# Patient Record
Sex: Female | Born: 1950 | Race: White | Hispanic: No | Smoking: Never smoker
Health system: Southern US, Community
[De-identification: ages and names within clinical notes are randomized; demographics above are authoritative.]

## PROBLEM LIST (undated history)

## (undated) DIAGNOSIS — N979 Female infertility, unspecified: Secondary | ICD-10-CM

## (undated) DIAGNOSIS — D219 Benign neoplasm of connective and other soft tissue, unspecified: Secondary | ICD-10-CM

## (undated) DIAGNOSIS — K7030 Alcoholic cirrhosis of liver without ascites: Secondary | ICD-10-CM

## (undated) HISTORY — DX: Benign neoplasm of connective and other soft tissue, unspecified: D21.9

## (undated) HISTORY — DX: Female infertility, unspecified: N97.9

## (undated) HISTORY — DX: Alcoholic cirrhosis of liver without ascites: K70.30

---

## 1990-01-14 HISTORY — PX: LAPAROSCOPY: SHX197

## 1997-05-27 ENCOUNTER — Other Ambulatory Visit: Admission: RE | Admit: 1997-05-27 | Discharge: 1997-05-27 | Payer: Self-pay | Admitting: Gynecology

## 1997-08-02 ENCOUNTER — Other Ambulatory Visit: Admission: RE | Admit: 1997-08-02 | Discharge: 1997-08-02 | Payer: Self-pay | Admitting: Gynecology

## 1998-07-27 ENCOUNTER — Other Ambulatory Visit: Admission: RE | Admit: 1998-07-27 | Discharge: 1998-07-27 | Payer: Self-pay | Admitting: Gynecology

## 1999-07-31 ENCOUNTER — Other Ambulatory Visit: Admission: RE | Admit: 1999-07-31 | Discharge: 1999-07-31 | Payer: Self-pay | Admitting: Gynecology

## 2002-10-26 ENCOUNTER — Other Ambulatory Visit: Admission: RE | Admit: 2002-10-26 | Discharge: 2002-10-26 | Payer: Self-pay | Admitting: Obstetrics and Gynecology

## 2003-11-18 ENCOUNTER — Other Ambulatory Visit: Admission: RE | Admit: 2003-11-18 | Discharge: 2003-11-18 | Payer: Self-pay | Admitting: Obstetrics and Gynecology

## 2005-04-18 ENCOUNTER — Ambulatory Visit: Payer: Self-pay | Admitting: Otolaryngology

## 2007-03-01 ENCOUNTER — Other Ambulatory Visit: Payer: Self-pay

## 2007-03-02 ENCOUNTER — Inpatient Hospital Stay: Payer: Self-pay | Admitting: Surgery

## 2007-09-23 ENCOUNTER — Inpatient Hospital Stay (HOSPITAL_COMMUNITY): Admission: RE | Admit: 2007-09-23 | Discharge: 2007-09-24 | Payer: Self-pay | Admitting: Urology

## 2009-01-14 HISTORY — PX: BOWEL RESECTION: SHX1257

## 2009-01-14 HISTORY — PX: PARTIAL HYSTERECTOMY: SHX80

## 2010-01-14 HISTORY — PX: ABDOMINAL SACROCOLPOPEXY: SHX1114

## 2010-02-04 ENCOUNTER — Encounter: Payer: Self-pay | Admitting: Obstetrics and Gynecology

## 2010-05-29 NOTE — Op Note (Signed)
Katie Castro, Katie Castro            ACCOUNT NO.:  [id number]   MEDICAL RECORD NO.:  [id number]          PATIENT TYPE:  INP   LOCATION:  1437                         FACILITY:  [facility]   PHYSICIAN:  Heloise Purpura, MD      DATE OF BIRTH:  [birth year]   DATE OF PROCEDURE:  09/23/2007  DATE OF DISCHARGE:                               OPERATIVE REPORT   PREOPERATIVE DIAGNOSIS:  Vaginal vault prolapse.   POSTOPERATIVE DIAGNOSIS:  Vaginal vault prolapse.   PROCEDURE:  1. Robotic assisted laparoscopic sacrocolpopexy.  2. Cystoscopy.   SURGEON:  Dr. Heloise Purpura.   FIRST ASSISTANT:  Dr. Lorin Picket MacDiarmid.   SECOND ASSISTANT:  Delia Chimes, nurse practitioner.   ANESTHESIA:  General.   COMPLICATIONS:  None.   ESTIMATED BLOOD LOSS:  Minimal.   INTRAVENOUS FLUIDS:  3500 mL of lactated Ringer's.   DRAINS:  16-French Foley catheter.   INDICATION:  Ms. 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  After a thorough evaluation  by Dr. Sherron Monday, she was felt to be a most appropriate candidate for  an abdominal sacrocolpopexy.  Due to her desire to have this performed  in a minimally invasive fashion, she did see me, and we discussed a  robotic-assisted laparoscopic approach which she did wish to proceed  with.  After discussion regarding the potential risks, complications,  and the potential benefits, she did wish to proceed.  Alternative  options were also discussed in detail with the patient.  Informed  consent was obtained.   DESCRIPTION OF PROCEDURE:  The patient was taken to the operating room,  and a general anesthetic was administered.  She was given preoperative  antibiotics, placed in the dorsal lithotomy position, prepped and draped  in the usual sterile fashion.  Next, a Foley catheter was inserted into  the bladder.  She was noted to have severe vault prolapse.  Due to the  fact that she did undergo a prior supracervical  hysterectomy, her cervix  was identified on pelvic examination.  A site was then selected just  superior to the umbilicus in the midline for placement of the camera  port.  This was placed using a standard open Hassan technique which  allowed entry into the peritoneal cavity under direct vision without  difficulty.  A 12-mm port was then placed and a pneumoperitoneum  established.  With the 0-degree lens, the abdomen was inspected.  Particularly, the left lower quadrant was inspected where the patient  did have a prior port site hernia from her prior laparoscopic  hysterectomy procedure.  There were no adhesions seen in this area, and  the remaining ports were then placed.  Bilateral 8-mm robotic ports were  placed 10 cm lateral to and just inferior to the camera port site.  An  additional 8-mm robotic port was placed in the far left lateral  abdominal wall.  A 12-mm port was then placed in the far right lateral  abdominal wall for laparoscopic assistance.  All ports were placed under  direct vision and without difficulty.  The surgical cart was then  docked.  With the aid of the cautery scissors, the vaginal apex and  cervix was identified.  A vaginal probe was then placed into the vagina  to help tent it superiorly.  Once the apex was identified, the overlying  cervical tissue was incised horizontally creating a flap between the  vagina and the cervix and bladder anteriorly as well as a flap created  posteriorly.  Once the flaps were created, attention then turned to the  sacral promontory.  The ProGrasp was used to hold the sigmoid colon over  to the left thereby exposing the sacral promontory.  The peritoneum was  then incised over the promontory thereby exposing the periosteum.  Care  was taken to avoid any small veins which were identified.  In addition,  the right ureter was identified laterally.  The peritoneum was then  incised in a caudal direction, and the Prolene mesh graft was  then  prepared.  It was sutured into a Y configuration, and the mesh was then  brought into the operative field.  The wide component of the mesh was  then placed over the anterior and posterior aspect of the vagina, and 2-  0 Gore-Tex sutures were used to tack the mesh to the vagina in 4  quadrants anteriorly and 4 quadrants posteriorly.  An appropriate amount  of length was left on the mesh with Dr. Mina Marble assistance, the  appropriate amount of support was identified.  Three 2-0 Gore-Tex  sutures were then placed into the periosteum of the sacral promontory  and used to secure the proximal end of the mesh.  Dr. Sherron Monday then  performed a vaginal exam and felt this resulted in excellent vaginal  support.  The peritoneum was then closed over the graft with 2-0 Vicryl  running suture.  In addition, the peritoneum was reapproximated in the  pelvis to obliterate the cul-de-sac and reduce the risk of enterocele.  At this point, the surgical cart was undocked.  Attention then turned to  cystoscopy.  Dr. Sherron Monday performed cystoscopy and noted blue reflux  from each ureteral orifice after the administration of indigo carmine.  The Foley catheter was then replaced, and preparations were made for  closure of the abdominal incisions.  The right lateral 12-mm port site  was closed with a 0 Vicryl suture placed with the aid of a suture passer  device.  All remaining ports were then removed under direct vision.  The  12-mm camera port was then removed, and this opening was closed with a  running 0 Vicryl suture.  The insufflation gas had been expelled prior  to closure of the abdomen.  Quarter percent Marcaine was then injected  into all incision sites which were reapproximated at the skin level with  4-0 Monocryl subcuticular closures.  Dermabond was applied to the skin.  The patient appeared to tolerate the procedure well and without  complications.  She was able to be extubated and  transferred to recovery  unit in satisfactory condition.      Heloise Purpura, MD  Electronically Signed     LB/MEDQ  D:  09/23/2007  T:  09/24/2007  Job:  [id number]

## 2010-05-29 NOTE — Discharge Summary (Signed)
NAMESARANNE, CRISLIP            ACCOUNT NO.:  [id number]   MEDICAL RECORD NO.:  [id number]          PATIENT TYPE:  INP   LOCATION:  1437                         FACILITY:  [facility]   PHYSICIAN:  Heloise Purpura, MD      DATE OF BIRTH:  29-Jul-1950   DATE OF ADMISSION:  09/23/2007  DATE OF DISCHARGE:  09/24/2007                               DISCHARGE SUMMARY   ADMISSION DIAGNOSIS:  Vaginal vault prolapse.   DISCHARGE DIAGNOSIS:  Vaginal vault prolapse.   HISTORY AND PHYSICAL:  For full details, please see admission history  and physical.  Briefly, Ms. Encinas is a 60 year old female with vaginal  wall prolapse, status post hysterectomy for uterine fibroids.  After  undergoing a urodynamic evaluation and discussing management options for  treatment of her vault prolapse, she elected to proceed with a robotic  assisted laparoscopic sacrocolpopexy.   HOSPITAL COURSE:  On 09/23/2007, the patient was taken the operating  room and underwent a robotic assisted laparoscopic sacrocolpopexy.  She  tolerated this procedure well and without complications.  Postoperatively, she was able to be transferred to a regular hospital  room following recovery from anesthesia.  She was monitored and remained  hemodynamically stable the night of surgery.  Her hemoglobin did  decrease from 13.7-11.5 the morning after surgery.  Although she had  excellent urine output and her heart rate remained stable, she did have  a slightly low blood pressure with systolic blood pressures measuring 90-  100.  She was closely monitored and continued to be stable.  Her  hemoglobin was rechecked 6 hours later and remained completely stable at  11.6.  It was therefore felt that this was dilutional and related to her  intravenous fluid hydration.  She did void following catheter removal on  postoperative day #1.  However, her postvoid residuals remained elevated  between 300-350 mL.  She was therefore instructed on  intermittent  catheterization and was able to be discharged home.  She was tolerating  a clear liquid diet.   DISPOSITION:  Home.   DISCHARGE MEDICATIONS:  She was instructed to resume her regular home  medications, except any aspirin, nonsteroidal anti-inflammatory drugs,  or herbal supplements.  She was given a prescription to take Vicodin as  needed for pain.   DISCHARGE INSTRUCTIONS:  She was instructed to be ambulatory, but  specifically told to refrain from any heavy lifting, strenuous activity,  or driving.  She was instructed not to engage in sexual intercourse or  place anything  in the vagina for 6 weeks.  In addition, she was instructed on clean  intermittent catheterization and told to catheterize after voiding until  her postvoid residuals were less than 150 mL.   FOLLOWUP:  She will follow-up as scheduled in 1-2 weeks for further  evaluation and for a PVR.      Heloise Purpura, MD  Electronically Signed     LB/MEDQ  D:  09/24/2007  T:  09/25/2007  Job:  [id number]   cc:   Martina Sinner, MD  Fax: [phone]

## 2010-10-17 LAB — CBC
HCT: 41.1
Platelets: 232
RDW: 14.2

## 2010-10-17 LAB — BASIC METABOLIC PANEL
BUN: 6
Calcium: 9.5
Creatinine, Ser: 0.57
GFR calc Af Amer: >60
GFR calc non Af Amer: >60

## 2010-10-17 LAB — HEMOGLOBIN AND HEMATOCRIT, BLOOD
HCT: 33.5 — ABNORMAL LOW
Hemoglobin: 11.6 — ABNORMAL LOW

## 2010-10-17 LAB — TYPE AND SCREEN

## 2013-05-14 HISTORY — PX: FOOT SURGERY: SHX648

## 2013-08-17 ENCOUNTER — Encounter: Payer: Self-pay | Admitting: Gynecology

## 2013-08-17 ENCOUNTER — Ambulatory Visit (INDEPENDENT_AMBULATORY_CARE_PROVIDER_SITE_OTHER): Payer: BC Managed Care – PPO | Admitting: Gynecology

## 2013-08-17 VITALS — BP 116/76 | HR 80 | Resp 18 | Ht 68.5 in | Wt 199.0 lb

## 2013-08-17 DIAGNOSIS — R319 Hematuria, unspecified: Secondary | ICD-10-CM

## 2013-08-17 DIAGNOSIS — Z0000 Encounter for general adult medical examination without abnormal findings: Secondary | ICD-10-CM

## 2013-08-17 DIAGNOSIS — Z90711 Acquired absence of uterus with remaining cervical stump: Secondary | ICD-10-CM | POA: Insufficient documentation

## 2013-08-17 DIAGNOSIS — Z90710 Acquired absence of both cervix and uterus: Secondary | ICD-10-CM | POA: Diagnosis not present

## 2013-08-17 DIAGNOSIS — Z9889 Other specified postprocedural states: Secondary | ICD-10-CM | POA: Diagnosis not present

## 2013-08-17 DIAGNOSIS — N950 Postmenopausal bleeding: Secondary | ICD-10-CM | POA: Diagnosis not present

## 2013-08-17 DIAGNOSIS — N3000 Acute cystitis without hematuria: Secondary | ICD-10-CM | POA: Diagnosis not present

## 2013-08-17 DIAGNOSIS — Z01419 Encounter for gynecological examination (general) (routine) without abnormal findings: Secondary | ICD-10-CM

## 2013-08-17 DIAGNOSIS — Z9049 Acquired absence of other specified parts of digestive tract: Secondary | ICD-10-CM | POA: Insufficient documentation

## 2013-08-17 DIAGNOSIS — Z1211 Encounter for screening for malignant neoplasm of colon: Secondary | ICD-10-CM

## 2013-08-17 DIAGNOSIS — Z124 Encounter for screening for malignant neoplasm of cervix: Secondary | ICD-10-CM | POA: Diagnosis not present

## 2013-08-17 LAB — HEMOGLOBIN, FINGERSTICK: Hemoglobin, fingerstick: 13.2 g/dL (ref 12.0–16.0)

## 2013-08-17 LAB — POCT URINALYSIS DIPSTICK
PH UA: 5
Urobilinogen, UA: NEGATIVE

## 2013-08-17 NOTE — Patient Instructions (Signed)

## 2013-08-17 NOTE — Progress Notes (Signed)
63 y.o. Single Caucasian female   G1P1001 here for annual exam. Pt reports menses are not regular due to Hysterectomy.  She does not report hot flashes, does have night sweats, does not know have vaginal dryness.  She is not using lubricants.  She does report post-menopasual bleeding.  Pt reports vaginal? Bleeding that has come and gone for 57m, can be enough for a pad. None for the past 35m?  Pt has a pelvic mesh placed in 2009-robotic sacrocolpopexy.  Last pap was pre-hysterectomy, no abnormal pap.    Patient's last menstrual period was 01/14/2006.          Sexually active: No.  The current method of family planning is status post hysterectomy.    Exercising: No.  The patient does not participate in regular exercise at present. Last pap: 2009 - wnl Abnormal PAP: no Mammogram: 2009- normal BSE: yes  Colonoscopy: never had one DEXA: 2009  Alcohol: 10 drinks/wk Tobacco: no  Hgb: 13.2 ; Urine: Nitrates +, Leuks 1  There are no preventive care reminders to display for this patient.  Family History  Problem Relation Age of Onset  . Diabetes Father   . Diabetes Brother   . Heart attack Father     There are no active problems to display for this patient.   Past Medical History  Diagnosis Date  . Fibroid   . Infertility, female     Past Surgical History  Procedure Laterality Date  . Partial hysterectomy  2011  . Foot surgery  05/2013  . Laparoscopy  1992    Allergies: Review of patient's allergies indicates no known allergies.  No current outpatient prescriptions on file.   No current facility-administered medications for this visit.    ROS: Pertinent items are noted in HPI.  Exam:    BP 116/76  Pulse 80  Resp 18  Ht 5' 8.5" (1.74 m)  Wt 199 lb (90.266 kg)  BMI 29.81 kg/m2  LMP 01/14/2006 Weight change: @WEIGHTCHANGE @ Last 3 height recordings:  Ht Readings from Last 3 Encounters:  08/17/13 5' 8.5" (1.74 m)   General appearance: alert, cooperative and appears  stated age Head: Normocephalic, without obvious abnormality, atraumatic Neck: no adenopathy, no carotid bruit, no JVD, supple, symmetrical, trachea midline and thyroid not enlarged, symmetric, no tenderness/mass/nodules Lungs: clear to auscultation bilaterally Breasts: Inspection negative, No nipple retraction or dimpling, No nipple discharge or bleeding Heart: regular rate and rhythm, S1, S2 normal, no murmur, click, rub or gallop Abdomen: soft, non-tender; bowel sounds normal; no masses,  no organomegaly Extremities: extremities normal, atraumatic, no cyanosis, marked edema right foot from recent surgery Skin: Skin color, texture, turgor normal. No rashes or lesions Lymph nodes: Cervical, supraclavicular, and axillary nodes normal. no inguinal nodes palpated Neurologic: Grossly normal   Pelvic: External genitalia:  no lesions              Urethra: normal appearing urethra with no masses, tenderness or lesions              Bartholins and Skenes: Bartholin's, Urethra, Skene's normal                 Vagina: normal appearing vagina with normal color and discharge, no lesions              Cervix: normal appearance              Pap taken: Yes.          Bimanual Exam:  Uterus:  absent  Adnexa:    no masses                                      Rectovaginal: Confirms                                      Anus:  normal sphincter tone, no lesions           1. Routine gynecological examination  Mammogram overdue-declines our making appt for her pap smear today counseled on menopause, adequate intake of calcium and vitamin D, diet and exercise return annually or prn Discussed PAP guideline changes, importance of weight bearing exercises, calcium, vit D and balanced diet. 2. Laboratory examination ordered as part of a routine general medical examination  - POCT Urinalysis Dipstick - Hemoglobin, fingerstick  3. Postmenopausal bleeding Pt with retained  cervix, not currently bleeding, last pap approx 6y ago, supracervical hysterectomy done laparoscopically for fibroids, discussed residual myometrium? Vs cervical bleeding. Vs urinary.  Assured mesh not contributory.  - Pap Test with HP (IPS) - US Transvaginal Non-OB; Future  4. Special screening for malignant neoplasms, colon overdue - Ambulatory referral to Gastroenterology  5. Screening for cervical cancer Reviewed guidelines based on retained cervix - Pap Test with HP (IPS)  6. Hematuria  - Urine culture  An After Visit Summary was printed and given to the patient.  Additional 65m spent counseling on ddx of PMB after hysterectomy> 50% face to face

## 2013-08-18 MED ORDER — CIPROFLOXACIN HCL 500 MG PO TABS: 500.0000 mg | ORAL_TABLET | Freq: Two times a day (BID) | ORAL | Status: DC

## 2013-08-18 NOTE — Addendum Note (Signed)
Addended by: Elveria Rising on: 08/18/2013 05:29 PM   Modules accepted: Orders

## 2013-08-19 ENCOUNTER — Telehealth: Payer: Self-pay | Admitting: *Deleted

## 2013-08-19 LAB — URINE CULTURE

## 2013-08-19 LAB — IPS PAP TEST WITH HPV

## 2013-08-19 NOTE — Telephone Encounter (Signed)
I have attempted to contact this patient by phone with the following results: left message to return my call on answering machine (home per [facility]).[phone]

## 2013-08-19 NOTE — Telephone Encounter (Signed)
Message copied by Graylon Good on Thu Aug 19, 2013 11:02 AM ------      Message from: Elveria Rising      Created: Wed Aug 18, 2013  5:28 PM       INFORM UTI, NKDA, CIPRO CALLED IN ------

## 2013-08-20 NOTE — Progress Notes (Signed)
Quick Note:  AEX scheduled for 08/19/14 ______

## 2013-08-23 NOTE — Telephone Encounter (Signed)
Pt notified in result note by Olga Millers on 08/23/13.

## 2013-08-25 ENCOUNTER — Telehealth: Payer: Self-pay | Admitting: Gynecology

## 2013-08-25 NOTE — Telephone Encounter (Signed)
Left message for patient to call back. Need to go over benefits and schedule PUS °

## 2013-09-06 ENCOUNTER — Encounter: Payer: Self-pay | Admitting: Gynecology

## 2013-09-06 ENCOUNTER — Ambulatory Visit (INDEPENDENT_AMBULATORY_CARE_PROVIDER_SITE_OTHER): Payer: BC Managed Care – PPO | Admitting: Gynecology

## 2013-09-06 ENCOUNTER — Telehealth: Payer: Self-pay | Admitting: Emergency Medicine

## 2013-09-06 VITALS — BP 154/90 | HR 88 | Resp 16 | Ht 68.5 in | Wt 203.0 lb

## 2013-09-06 DIAGNOSIS — K645 Perianal venous thrombosis: Secondary | ICD-10-CM

## 2013-09-06 DIAGNOSIS — K625 Hemorrhage of anus and rectum: Secondary | ICD-10-CM

## 2013-09-06 DIAGNOSIS — N950 Postmenopausal bleeding: Secondary | ICD-10-CM

## 2013-09-06 LAB — CBC WITH DIFFERENTIAL/PLATELET
BASOS PCT: 0 % (ref 0–1)
Basophils Absolute: 0 10*3/uL (ref 0.0–0.1)
EOS ABS: 0.3 10*3/uL (ref 0.0–0.7)
EOS PCT: 5 % (ref 0–5)
HCT: 37.5 % (ref 36.0–46.0)
Hemoglobin: 12.7 g/dL (ref 12.0–15.0)
LYMPHS ABS: 1 10*3/uL (ref 0.7–4.0)
Lymphocytes Relative: 18 % (ref 12–46)
MCH: 33.9 pg (ref 26.0–34.0)
MCHC: 33.9 g/dL (ref 30.0–36.0)
MCV: 100 fL (ref 78.0–100.0)
Monocytes Absolute: 0.6 10*3/uL (ref 0.1–1.0)
Monocytes Relative: 10 % (ref 3–12)
NEUTROS PCT: 67 % (ref 43–77)
Neutro Abs: 3.9 10*3/uL (ref 1.7–7.7)
PLATELETS: 159 10*3/uL (ref 150–400)
RBC: 3.75 MIL/uL — AB (ref 3.87–5.11)
RDW: 14.4 % (ref 11.5–15.5)
WBC: 5.8 10*3/uL (ref 4.0–10.5)

## 2013-09-06 LAB — POCT URINALYSIS DIPSTICK
Blood, UA: 2
LEUKOCYTES UA: NEGATIVE
PH UA: 5
UROBILINOGEN UA: NEGATIVE

## 2013-09-06 MED ORDER — HYDROCORTISONE ACETATE 25 MG RE SUPP: 25.0000 mg | Freq: Two times a day (BID) | RECTAL | Status: DC

## 2013-09-06 NOTE — Addendum Note (Signed)
Addended by: Elveria Rising on: 09/06/2013 05:16 PM   Modules accepted: Level of Service

## 2013-09-06 NOTE — Progress Notes (Signed)
Subjective:     Patient ID: Katie Castro, female   DOB: [birth year], 63 y.o.   MRN: [id number]  HPI Comments: Pt called this am reporting pelvic cramping and bleeding that she noticed over the weekend.  Pt reports a moderate amount of blood in the toilet twice, she placed a pad and saw no further bleeding.  Pt states she did not have a bowel movement before the bleeding.  Pt reported some abdominal bruising as well. Pt had a vaginal vault suspension, supracervical hysterectomy and SBO with resection.   Pt referred for colonoscopy, appt 10/23/13. PUS scheduled  Vaginal Bleeding Pertinent negatives include no chills, constipation or diarrhea.     Review of Systems  Constitutional: Negative for chills and fatigue.  Gastrointestinal: Positive for blood in stool. Negative for diarrhea, constipation and rectal pain.  Genitourinary: Positive for vaginal bleeding.  Neurological: Negative for dizziness.       Objective:   Physical Exam  Nursing note and vitals reviewed. Constitutional: She is oriented to person, place, and time. She appears well-developed and well-nourished.  Genitourinary:   Pelvic: External genitalia:  Mild erosions              Urethra:  normal appearing urethra with no masses, tenderness or lesions              Bartholins and Skenes: normal                 Vagina: normal appearing vagina with normal color and discharge, no lesions, no blood              Cervix: normal appearance, no bleeding                                    Rectovaginal: Confirms, no bleeding, no fissue               Anus:  normal sphincter tone, no lesions   Neurological: She is alert and oriented to person, place, and time.       Assessment:     PMB, no evidence of cervical source-     Plan:     Anoscopy performed, anoscope advanced into rectal canal, thrombosed hemorrhoids noted high, no active bleeding, no tenderness, small spot of blood noted on slow retreat of anoscope Pt  tolerated well, findings reviewed anusol HC suppositiories. Will see if Dr Collene Mares wants to see pt sooner

## 2013-09-06 NOTE — Telephone Encounter (Signed)
Patient is having blood in stool.

## 2013-09-06 NOTE — Telephone Encounter (Signed)
Spoke with Katie Castro at Dr. Lorie Apley office.  Patient having some rectal bleeding and asked by Dr. Charlies Constable to see if Dr. Collene Mares would like to see patient earlier for colonscopy. CBC pending.  Katie Castro requested that I send office notes and she will have Dr. Collene Mares review.  Katie Castro states that patient chose this date 10/23/13 for colonoscopy.

## 2013-09-06 NOTE — Telephone Encounter (Signed)
Spoke with patient. Advised that I spoke with Dr.Lathrop and she would like her to come in to be seen today. Patient is agreeable but lives in [place]. Patient can be here between 12:45-1pm. Placed on schedule for 12:55pm. Advised that we are working her in to Dr.Lathrops schedule. Patient agreeable.  Routing to provider for final review. Patient agreeable to disposition. Will close encounter

## 2013-09-06 NOTE — Telephone Encounter (Signed)
Spoke with patient. Patient states that she was seen with Dr.Lathrop this month and was having blood in her "stool" that Dr.Lathrop wanted to see where it was coming from. Patient states that she thinks "we can rule out the bladder and anus. I think it is vaginal. I have had three laparoscopic surgeries with an abdominal mesh. It is a lot of blood." Patient states that it started on Saturday and again this morning. States that it occurs with using the restroom and is usually in the middle of the night. Patient has been experiencing stomach "discomfort" that feels like cramping. States Dr.Lathrop noticed bruising on lower abdomen when she was seen last. Advised would speak with Dr.Lathrop and give patient a call back with further recommendations and instructions. Patient agreeable.

## 2013-09-06 NOTE — Telephone Encounter (Signed)
Left message to call [place] Warriner at [phone]. 

## 2013-09-08 ENCOUNTER — Telehealth: Payer: Self-pay | Admitting: *Deleted

## 2013-09-08 LAB — URINE CULTURE

## 2013-09-08 NOTE — Telephone Encounter (Signed)
Left Message To Call Back  

## 2013-09-08 NOTE — Telephone Encounter (Signed)
Message copied by Alfonzo Feller on Wed Sep 08, 2013  9:46 AM ------      Message from: Elveria Rising      Created: Tue Sep 07, 2013  5:38 PM       Cbc looks great! ------

## 2013-09-10 ENCOUNTER — Telehealth: Payer: Self-pay | Admitting: Gynecology

## 2013-09-10 NOTE — Telephone Encounter (Signed)
Spoke with patient. Advised that per benefit quote received, she will be responsible for $30 copay when she comes in for PUS. °Patient agreeable. °Scheduled PUS. °Advised patient of 72 hour cancellation policy and $100 cancellation fee. Patient agreeable. °

## 2013-09-10 NOTE — Telephone Encounter (Signed)
Patient notified see result note 

## 2013-09-14 ENCOUNTER — Ambulatory Visit (INDEPENDENT_AMBULATORY_CARE_PROVIDER_SITE_OTHER): Payer: BC Managed Care – PPO

## 2013-09-14 ENCOUNTER — Encounter: Payer: Self-pay | Admitting: Gynecology

## 2013-09-14 ENCOUNTER — Ambulatory Visit (INDEPENDENT_AMBULATORY_CARE_PROVIDER_SITE_OTHER): Payer: BC Managed Care – PPO | Admitting: Gynecology

## 2013-09-14 ENCOUNTER — Telehealth: Payer: Self-pay | Admitting: Internal Medicine

## 2013-09-14 VITALS — BP 128/74 | Resp 18 | Ht 68.5 in | Wt 206.0 lb

## 2013-09-14 DIAGNOSIS — Z90710 Acquired absence of both cervix and uterus: Secondary | ICD-10-CM

## 2013-09-14 DIAGNOSIS — N950 Postmenopausal bleeding: Secondary | ICD-10-CM

## 2013-09-14 DIAGNOSIS — Z90711 Acquired absence of uterus with remaining cervical stump: Secondary | ICD-10-CM

## 2013-09-14 LAB — CBC
HEMATOCRIT: 35.5 % — AB (ref 36.0–46.0)
Hemoglobin: 11.9 g/dL — ABNORMAL LOW (ref 12.0–15.0)
MCH: 33.3 pg (ref 26.0–34.0)
MCHC: 33.5 g/dL (ref 30.0–36.0)
MCV: 99.4 fL (ref 78.0–100.0)
PLATELETS: 188 10*3/uL (ref 150–400)
RBC: 3.57 MIL/uL — ABNORMAL LOW (ref 3.87–5.11)
RDW: 14.5 % (ref 11.5–15.5)
WBC: 5.9 10*3/uL (ref 4.0–10.5)

## 2013-09-14 NOTE — Progress Notes (Signed)
     Pt here for PUS due to recurrent bleeding, pt had a supracervical hysterectomy for fibroids done laparoscopically.  We wanted to rule out residual uterine tissue, negative PAP. Pt reports having 2 more episodes of bleeding both at night that scared her.  She did not start the suppositories but did pick up the Rx. Images reviewed with pt Her cervix appears normal, no fluid in cavity is noted, no myometrium noted.  Ovaries are menopausal in appearance, no free fluid A/P: Recurrent bleeding, neg PUS and PAP Pt scheduled for colonoscopy 10/10, have stressed need to move up. Will try to make appt for pt. CBC now Questions addressed 19m spent counseling, >50% face to face

## 2013-09-14 NOTE — Telephone Encounter (Signed)
Dr. Ardis Hughs is Doc of the Day, please see message below.

## 2013-09-15 NOTE — Telephone Encounter (Signed)
Happy to see her, but the Dr's office note states she is scheduled for colonoscoy 10/10.  Not sure if that is true. She is not scheduled with [place].  Can you clarify with Dr., patient.   Could we add her on with extender or MD of the day sometime this week?

## 2013-09-15 NOTE — Telephone Encounter (Signed)
She is a Dr Collene Mares pt and has an appt on 10/11/13 to see her.  She will stay with that practice.

## 2013-11-15 ENCOUNTER — Encounter: Payer: Self-pay | Admitting: Gynecology

## 2013-11-24 ENCOUNTER — Ambulatory Visit: Payer: Self-pay | Admitting: Urgent Care

## 2013-11-24 ENCOUNTER — Ambulatory Visit: Payer: Self-pay | Admitting: Unknown Physician Specialty

## 2013-11-24 LAB — CBC WITH DIFFERENTIAL/PLATELET
BASOS ABS: 0.1 10*3/uL (ref 0.0–0.1)
Basophil %: 1.1 %
EOS ABS: 0.1 10*3/uL (ref 0.0–0.7)
Eosinophil %: 1.3 %
HCT: 31.7 % — AB (ref 35.0–47.0)
HGB: 10.6 g/dL — ABNORMAL LOW (ref 12.0–16.0)
LYMPHS PCT: 14.1 %
Lymphocyte #: 1.3 10*3/uL (ref 1.0–3.6)
MCH: 37.2 pg — AB (ref 26.0–34.0)
MCHC: 33.5 g/dL (ref 32.0–36.0)
MCV: 111 fL — ABNORMAL HIGH (ref 80–100)
Monocyte #: 0.6 x10 3/mm (ref 0.2–0.9)
Monocyte %: 6 %
NEUTROS ABS: 7.3 10*3/uL — AB (ref 1.4–6.5)
NEUTROS PCT: 77.5 %
PLATELETS: 141 10*3/uL — AB (ref 150–440)
RBC: 2.86 10*6/uL — ABNORMAL LOW (ref 3.80–5.20)
RDW: 17.7 % — ABNORMAL HIGH (ref 11.5–14.5)
WBC: 9.4 10*3/uL (ref 3.6–11.0)

## 2013-11-24 LAB — HEPATIC FUNCTION PANEL A (ARMC)
ALBUMIN: 2.6 g/dL — AB (ref 3.4–5.0)
ALK PHOS: 585 U/L — AB
ALT: 78 U/L — AB
AST: 283 U/L — AB (ref 15–37)
Bilirubin, Direct: 7.7 mg/dL — ABNORMAL HIGH (ref 0.0–0.2)
Bilirubin,Total: 10.1 mg/dL — ABNORMAL HIGH (ref 0.2–1.0)
TOTAL PROTEIN: 7.6 g/dL (ref 6.4–8.2)

## 2013-11-24 LAB — BASIC METABOLIC PANEL
ANION GAP: 14 (ref 7–16)
BUN: 3 mg/dL — ABNORMAL LOW (ref 7–18)
CO2: 24 mmol/L (ref 21–32)
CREATININE: 0.46 mg/dL — AB (ref 0.60–1.30)
Calcium, Total: 8.4 mg/dL — ABNORMAL LOW (ref 8.5–10.1)
Chloride: 99 mmol/L (ref 98–107)
EGFR (African American): >60
GLUCOSE: 87 mg/dL (ref 65–99)
OSMOLALITY: 270 (ref 275–301)
POTASSIUM: 3.5 mmol/L (ref 3.5–5.1)
SODIUM: 137 mmol/L (ref 136–145)

## 2013-11-24 LAB — ETHANOL: Ethanol: 138 mg/dL

## 2013-12-01 ENCOUNTER — Ambulatory Visit: Payer: Self-pay | Admitting: Urgent Care

## 2013-12-01 LAB — HEPATIC FUNCTION PANEL A (ARMC)
Albumin: 2.4 g/dL — ABNORMAL LOW (ref 3.4–5.0)
Alkaline Phosphatase: 428 U/L — ABNORMAL HIGH
Bilirubin, Direct: 15 mg/dL — ABNORMAL HIGH (ref 0.0–0.2)
Bilirubin,Total: 19.5 mg/dL — ABNORMAL HIGH (ref 0.2–1.0)
SGOT(AST): 157 U/L — ABNORMAL HIGH (ref 15–37)
SGPT (ALT): 50 U/L
Total Protein: 7.3 g/dL (ref 6.4–8.2)

## 2013-12-01 LAB — MAGNESIUM: Magnesium: 1.7 mg/dL — ABNORMAL LOW

## 2013-12-01 LAB — CBC WITH DIFFERENTIAL/PLATELET
Bands: 10 %
HCT: 31.4 % — AB (ref 35.0–47.0)
HGB: 10.7 g/dL — AB (ref 12.0–16.0)
Lymphocytes: 10 %
MCH: 38.3 pg — AB (ref 26.0–34.0)
MCHC: 34 g/dL (ref 32.0–36.0)
MCV: 113 fL — ABNORMAL HIGH (ref 80–100)
Monocytes: 6 %
PLATELETS: 163 10*3/uL (ref 150–440)
RBC: 2.78 10*6/uL — ABNORMAL LOW (ref 3.80–5.20)
RDW: 18.7 % — AB (ref 11.5–14.5)
SEGMENTED NEUTROPHILS: 74 %
WBC: 10.5 10*3/uL (ref 3.6–11.0)

## 2013-12-01 LAB — BASIC METABOLIC PANEL
Anion Gap: 10 (ref 7–16)
BUN: 5 mg/dL — ABNORMAL LOW (ref 7–18)
Calcium, Total: 8.5 mg/dL (ref 8.5–10.1)
Chloride: 95 mmol/L — ABNORMAL LOW (ref 98–107)
Co2: 27 mmol/L (ref 21–32)
Creatinine: 0.64 mg/dL (ref 0.60–1.30)
EGFR (African American): >60
EGFR (Non-African Amer.): >60
Glucose: 107 mg/dL — ABNORMAL HIGH (ref 65–99)
Osmolality: 262 (ref 275–301)
Potassium: 2.9 mmol/L — ABNORMAL LOW (ref 3.5–5.1)
Sodium: 132 mmol/L — ABNORMAL LOW (ref 136–145)

## 2013-12-01 LAB — ETHANOL: Ethanol: <3 mg/dL

## 2013-12-01 LAB — PROTIME-INR
INR: 1.6
Prothrombin Time: 18.3 secs — ABNORMAL HIGH (ref 11.5–14.7)

## 2013-12-02 LAB — AFP TUMOR MARKER: AFP TUMOR MARKER: 2.2 ng/mL (ref 0.0–8.3)

## 2013-12-03 ENCOUNTER — Ambulatory Visit: Payer: Self-pay | Admitting: Urgent Care

## 2013-12-03 LAB — CBC WITH DIFFERENTIAL/PLATELET
BANDS NEUTROPHIL: 7 %
HCT: 30.8 % — AB (ref 35.0–47.0)
HGB: 10.4 g/dL — ABNORMAL LOW (ref 12.0–16.0)
Lymphocytes: 7 %
MCH: 38.2 pg — AB (ref 26.0–34.0)
MCHC: 33.8 g/dL (ref 32.0–36.0)
MCV: 113 fL — ABNORMAL HIGH (ref 80–100)
Metamyelocyte: 1 %
Monocytes: 5 %
Platelet: 175 10*3/uL (ref 150–440)
RBC: 2.73 10*6/uL — ABNORMAL LOW (ref 3.80–5.20)
RDW: 19 % — ABNORMAL HIGH (ref 11.5–14.5)
Segmented Neutrophils: 80 %
WBC: 9.9 10*3/uL (ref 3.6–11.0)

## 2013-12-03 LAB — HEPATIC FUNCTION PANEL A (ARMC)
ALBUMIN: 2.3 g/dL — AB (ref 3.4–5.0)
Alkaline Phosphatase: 380 U/L — ABNORMAL HIGH
BILIRUBIN DIRECT: 14.6 mg/dL — AB (ref 0.0–0.2)
Bilirubin,Total: 19.6 mg/dL — ABNORMAL HIGH (ref 0.2–1.0)
SGOT(AST): 145 U/L — ABNORMAL HIGH (ref 15–37)
SGPT (ALT): 44 U/L
TOTAL PROTEIN: 7.1 g/dL (ref 6.4–8.2)

## 2013-12-03 LAB — BASIC METABOLIC PANEL
ANION GAP: 9 (ref 7–16)
BUN: 5 mg/dL — AB (ref 7–18)
CALCIUM: 7.9 mg/dL — AB (ref 8.5–10.1)
CO2: 25 mmol/L (ref 21–32)
CREATININE: 0.41 mg/dL — AB (ref 0.60–1.30)
Chloride: 101 mmol/L (ref 98–107)
EGFR (African American): >60
EGFR (Non-African Amer.): >60
Glucose: 88 mg/dL (ref 65–99)
Osmolality: 267 (ref 275–301)
POTASSIUM: 2.9 mmol/L — AB (ref 3.5–5.1)
Sodium: 135 mmol/L — ABNORMAL LOW (ref 136–145)

## 2013-12-03 LAB — PROTIME-INR
INR: 1.6
PROTHROMBIN TIME: 18.7 s — AB (ref 11.5–14.7)

## 2013-12-06 ENCOUNTER — Ambulatory Visit: Payer: Self-pay | Admitting: Urgent Care

## 2013-12-06 LAB — BASIC METABOLIC PANEL
Anion Gap: 11 (ref 7–16)
BUN: 5 mg/dL — ABNORMAL LOW (ref 7–18)
CALCIUM: 8.2 mg/dL — AB (ref 8.5–10.1)
CHLORIDE: 101 mmol/L (ref 98–107)
CREATININE: 0.55 mg/dL — AB (ref 0.60–1.30)
Co2: 22 mmol/L (ref 21–32)
Glucose: 115 mg/dL — ABNORMAL HIGH (ref 65–99)
Osmolality: 266 (ref 275–301)
POTASSIUM: 3.7 mmol/L (ref 3.5–5.1)
Sodium: 134 mmol/L — ABNORMAL LOW (ref 136–145)

## 2013-12-06 LAB — CBC WITH DIFFERENTIAL/PLATELET
BASOS PCT: 1.2 %
Basophil #: 0.2 10*3/uL — ABNORMAL HIGH (ref 0.0–0.1)
EOS ABS: 0.1 10*3/uL (ref 0.0–0.7)
EOS PCT: 0.9 %
HCT: 31.1 % — ABNORMAL LOW (ref 35.0–47.0)
HGB: 10.4 g/dL — AB (ref 12.0–16.0)
LYMPHS ABS: 2.4 10*3/uL (ref 1.0–3.6)
LYMPHS PCT: 19.8 %
MCH: 38.1 pg — AB (ref 26.0–34.0)
MCHC: 33.6 g/dL (ref 32.0–36.0)
MCV: 113 fL — AB (ref 80–100)
MONO ABS: 0.7 x10 3/mm (ref 0.2–0.9)
Monocyte %: 5.4 %
NEUTROS ABS: 8.9 10*3/uL — AB (ref 1.4–6.5)
Neutrophil %: 72.7 %
Platelet: 208 10*3/uL (ref 150–440)
RBC: 2.75 10*6/uL — ABNORMAL LOW (ref 3.80–5.20)
RDW: 18.1 % — AB (ref 11.5–14.5)
WBC: 12.2 10*3/uL — ABNORMAL HIGH (ref 3.6–11.0)

## 2013-12-06 LAB — HEPATIC FUNCTION PANEL A (ARMC)
Albumin: 2.2 g/dL — ABNORMAL LOW (ref 3.4–5.0)
Alkaline Phosphatase: 373 U/L — ABNORMAL HIGH
BILIRUBIN DIRECT: 18 mg/dL — AB (ref 0.0–0.2)
Bilirubin,Total: 21.9 mg/dL — ABNORMAL HIGH (ref 0.2–1.0)
SGOT(AST): 154 U/L — ABNORMAL HIGH (ref 15–37)
SGPT (ALT): 41 U/L
Total Protein: 7.1 g/dL (ref 6.4–8.2)

## 2013-12-06 LAB — PROTIME-INR
INR: 1.7
Prothrombin Time: 19.5 secs — ABNORMAL HIGH (ref 11.5–14.7)

## 2013-12-06 LAB — MAGNESIUM: MAGNESIUM: 2.1 mg/dL

## 2013-12-13 ENCOUNTER — Ambulatory Visit: Payer: Self-pay | Admitting: Urgent Care

## 2013-12-13 LAB — BASIC METABOLIC PANEL
ANION GAP: 10 (ref 7–16)
BUN: 10 mg/dL (ref 7–18)
CALCIUM: 8.7 mg/dL (ref 8.5–10.1)
CO2: 23 mmol/L (ref 21–32)
CREATININE: 0.64 mg/dL (ref 0.60–1.30)
Chloride: 99 mmol/L (ref 98–107)
EGFR (African American): >60
Glucose: 150 mg/dL — ABNORMAL HIGH (ref 65–99)
Osmolality: 266 (ref 275–301)
Potassium: 4.1 mmol/L (ref 3.5–5.1)
SODIUM: 132 mmol/L — AB (ref 136–145)

## 2013-12-13 LAB — CBC WITH DIFFERENTIAL/PLATELET
Basophil #: 0.2 10*3/uL — ABNORMAL HIGH (ref 0.0–0.1)
Basophil %: 1.5 %
EOS ABS: 0 10*3/uL (ref 0.0–0.7)
Eosinophil %: 0.3 %
HCT: 34.5 % — AB (ref 35.0–47.0)
HGB: 11.5 g/dL — ABNORMAL LOW (ref 12.0–16.0)
Lymphocyte #: 0.3 10*3/uL — ABNORMAL LOW (ref 1.0–3.6)
Lymphocyte %: 2.1 %
MCH: 37.3 pg — AB (ref 26.0–34.0)
MCHC: 33.2 g/dL (ref 32.0–36.0)
MCV: 112 fL — ABNORMAL HIGH (ref 80–100)
MONOS PCT: 8.7 %
Monocyte #: 1.3 x10 3/mm — ABNORMAL HIGH (ref 0.2–0.9)
NEUTROS ABS: 12.9 10*3/uL — AB (ref 1.4–6.5)
NEUTROS PCT: 87.4 %
PLATELETS: 291 10*3/uL (ref 150–440)
RBC: 3.07 10*6/uL — ABNORMAL LOW (ref 3.80–5.20)
RDW: 17.3 % — ABNORMAL HIGH (ref 11.5–14.5)
WBC: 14.7 10*3/uL — ABNORMAL HIGH (ref 3.6–11.0)

## 2013-12-13 LAB — HEPATIC FUNCTION PANEL A (ARMC)
ALK PHOS: 350 U/L — AB
ALT: 40 U/L
Albumin: 2.2 g/dL — ABNORMAL LOW (ref 3.4–5.0)
BILIRUBIN TOTAL: 20.8 mg/dL — AB (ref 0.2–1.0)
Bilirubin, Direct: 17 mg/dL — ABNORMAL HIGH (ref 0.0–0.2)
SGOT(AST): 139 U/L — ABNORMAL HIGH (ref 15–37)
TOTAL PROTEIN: 7.3 g/dL (ref 6.4–8.2)

## 2013-12-13 LAB — PROTIME-INR
INR: 1.6
Prothrombin Time: 18.9 secs — ABNORMAL HIGH (ref 11.5–14.7)

## 2013-12-13 LAB — MAGNESIUM: MAGNESIUM: 2.1 mg/dL

## 2013-12-16 ENCOUNTER — Ambulatory Visit: Payer: Self-pay | Admitting: Urgent Care

## 2013-12-16 LAB — CBC WITH DIFFERENTIAL/PLATELET
Basophil #: 0 10*3/uL (ref 0.0–0.1)
Basophil %: 0.1 %
EOS ABS: 0.1 10*3/uL (ref 0.0–0.7)
EOS PCT: 0.3 %
HCT: 35.1 % (ref 35.0–47.0)
HGB: 11.6 g/dL — ABNORMAL LOW (ref 12.0–16.0)
LYMPHS PCT: 6.8 %
Lymphocyte #: 1.3 10*3/uL (ref 1.0–3.6)
MCH: 36.9 pg — ABNORMAL HIGH (ref 26.0–34.0)
MCHC: 33 g/dL (ref 32.0–36.0)
MCV: 112 fL — AB (ref 80–100)
MONO ABS: 0.6 x10 3/mm (ref 0.2–0.9)
Monocyte %: 3.1 %
NEUTROS PCT: 89.7 %
Neutrophil #: 16.9 10*3/uL — ABNORMAL HIGH (ref 1.4–6.5)
Platelet: 257 10*3/uL (ref 150–440)
RBC: 3.15 10*6/uL — ABNORMAL LOW (ref 3.80–5.20)
RDW: 17 % — AB (ref 11.5–14.5)
WBC: 18.8 10*3/uL — AB (ref 3.6–11.0)

## 2013-12-16 LAB — PROTIME-INR
INR: 1.6
PROTHROMBIN TIME: 18.6 s — AB (ref 11.5–14.7)

## 2013-12-24 ENCOUNTER — Inpatient Hospital Stay: Payer: Self-pay | Admitting: Internal Medicine

## 2013-12-24 LAB — CBC WITH DIFFERENTIAL/PLATELET
Basophil #: 0.1 10*3/uL (ref 0.0–0.1)
Basophil %: 0.4 %
EOS PCT: 0 %
Eosinophil #: 0 10*3/uL (ref 0.0–0.7)
HCT: 31.5 % — ABNORMAL LOW (ref 35.0–47.0)
HGB: 10.5 g/dL — ABNORMAL LOW (ref 12.0–16.0)
LYMPHS ABS: 0.4 10*3/uL — AB (ref 1.0–3.6)
LYMPHS PCT: 2.2 %
MCH: 36.6 pg — AB (ref 26.0–34.0)
MCHC: 33.5 g/dL (ref 32.0–36.0)
MCV: 109 fL — ABNORMAL HIGH (ref 80–100)
Monocyte #: 0.5 x10 3/mm (ref 0.2–0.9)
Monocyte %: 3 %
Neutrophil #: 16.1 10*3/uL — ABNORMAL HIGH (ref 1.4–6.5)
Neutrophil %: 94.4 %
PLATELETS: 112 10*3/uL — AB (ref 150–440)
RBC: 2.88 10*6/uL — ABNORMAL LOW (ref 3.80–5.20)
RDW: 15.8 % — ABNORMAL HIGH (ref 11.5–14.5)
WBC: 17 10*3/uL — ABNORMAL HIGH (ref 3.6–11.0)

## 2013-12-24 LAB — URINALYSIS, COMPLETE
Blood: NEGATIVE
Glucose,UR: NEGATIVE mg/dL (ref 0–75)
Ketone: NEGATIVE
NITRITE: NEGATIVE
PH: 5 (ref 4.5–8.0)
Protein: NEGATIVE
RBC,UR: 29 /HPF (ref 0–5)
SPECIFIC GRAVITY: 1.018 (ref 1.003–1.030)
Squamous Epithelial: 8
WBC UR: 19 /HPF (ref 0–5)

## 2013-12-24 LAB — AMMONIA: Ammonia, Plasma: <10 mcmol/L (ref 11–32)

## 2013-12-24 LAB — HEPATIC FUNCTION PANEL A (ARMC)
ALK PHOS: 241 U/L — AB
Albumin: 2.3 g/dL — ABNORMAL LOW (ref 3.4–5.0)
BILIRUBIN DIRECT: 10.3 mg/dL — AB (ref 0.0–0.2)
Bilirubin,Total: 12.9 mg/dL — ABNORMAL HIGH (ref 0.2–1.0)
SGOT(AST): 85 U/L — ABNORMAL HIGH (ref 15–37)
SGPT (ALT): 97 U/L — ABNORMAL HIGH
Total Protein: 7.1 g/dL (ref 6.4–8.2)

## 2013-12-24 LAB — PROTIME-INR
INR: 1.6
Prothrombin Time: 18.8 secs — ABNORMAL HIGH (ref 11.5–14.7)

## 2013-12-24 LAB — BASIC METABOLIC PANEL
ANION GAP: 11 (ref 7–16)
BUN: 17 mg/dL (ref 7–18)
CREATININE: 0.69 mg/dL (ref 0.60–1.30)
Calcium, Total: 8.3 mg/dL — ABNORMAL LOW (ref 8.5–10.1)
Chloride: 97 mmol/L — ABNORMAL LOW (ref 98–107)
Co2: 20 mmol/L — ABNORMAL LOW (ref 21–32)
Glucose: 172 mg/dL — ABNORMAL HIGH (ref 65–99)
Osmolality: 263 (ref 275–301)
POTASSIUM: 3.8 mmol/L (ref 3.5–5.1)
SODIUM: 128 mmol/L — AB (ref 136–145)

## 2013-12-24 LAB — HEMOGLOBIN A1C: HEMOGLOBIN A1C: 4.5 % (ref 4.2–6.3)

## 2013-12-25 LAB — MAGNESIUM: MAGNESIUM: 2 mg/dL

## 2013-12-25 LAB — HEPATIC FUNCTION PANEL A (ARMC)
ALBUMIN: 2 g/dL — AB (ref 3.4–5.0)
ALT: 84 U/L — AB
Alkaline Phosphatase: 199 U/L — ABNORMAL HIGH
Bilirubin, Direct: 8.5 mg/dL — ABNORMAL HIGH (ref 0.0–0.2)
Bilirubin,Total: 10.4 mg/dL — ABNORMAL HIGH (ref 0.2–1.0)
SGOT(AST): 65 U/L — ABNORMAL HIGH (ref 15–37)
TOTAL PROTEIN: 6.1 g/dL — AB (ref 6.4–8.2)

## 2013-12-25 LAB — CBC WITH DIFFERENTIAL/PLATELET
BASOS ABS: 0.3 10*3/uL — AB (ref 0.0–0.1)
Basophil %: 2 %
EOS ABS: 0 10*3/uL (ref 0.0–0.7)
Eosinophil %: 0.2 %
HCT: 28 % — ABNORMAL LOW (ref 35.0–47.0)
HGB: 9.3 g/dL — ABNORMAL LOW (ref 12.0–16.0)
LYMPHS ABS: 0.6 10*3/uL — AB (ref 1.0–3.6)
Lymphocyte %: 4.3 %
MCH: 36.7 pg — AB (ref 26.0–34.0)
MCHC: 33.2 g/dL (ref 32.0–36.0)
MCV: 111 fL — AB (ref 80–100)
MONOS PCT: 4.2 %
Monocyte #: 0.6 x10 3/mm (ref 0.2–0.9)
NEUTROS ABS: 12.3 10*3/uL — AB (ref 1.4–6.5)
Neutrophil %: 89.3 %
Platelet: 90 10*3/uL — ABNORMAL LOW (ref 150–440)
RBC: 2.53 10*6/uL — AB (ref 3.80–5.20)
RDW: 15.3 % — AB (ref 11.5–14.5)
WBC: 13.7 10*3/uL — ABNORMAL HIGH (ref 3.6–11.0)

## 2013-12-25 LAB — BASIC METABOLIC PANEL
Anion Gap: 8 (ref 7–16)
BUN: 13 mg/dL (ref 7–18)
CHLORIDE: 99 mmol/L (ref 98–107)
CREATININE: 0.64 mg/dL (ref 0.60–1.30)
Calcium, Total: 8.1 mg/dL — ABNORMAL LOW (ref 8.5–10.1)
Co2: 23 mmol/L (ref 21–32)
EGFR (African American): >60
EGFR (Non-African Amer.): >60
GLUCOSE: 142 mg/dL — AB (ref 65–99)
OSMOLALITY: 263 (ref 275–301)
Potassium: 3.5 mmol/L (ref 3.5–5.1)
Sodium: 130 mmol/L — ABNORMAL LOW (ref 136–145)

## 2013-12-26 LAB — CBC WITH DIFFERENTIAL/PLATELET
Basophil #: 0.2 10*3/uL — ABNORMAL HIGH (ref 0.0–0.1)
Basophil %: 1.6 %
Eosinophil #: 0 10*3/uL (ref 0.0–0.7)
Eosinophil %: 0.2 %
HCT: 30.1 % — ABNORMAL LOW (ref 35.0–47.0)
HGB: 10.1 g/dL — ABNORMAL LOW (ref 12.0–16.0)
Lymphocyte #: 0.7 10*3/uL — ABNORMAL LOW (ref 1.0–3.6)
Lymphocyte %: 7 %
MCH: 37 pg — ABNORMAL HIGH (ref 26.0–34.0)
MCHC: 33.8 g/dL (ref 32.0–36.0)
MCV: 110 fL — ABNORMAL HIGH (ref 80–100)
Monocyte #: 0.4 x10 3/mm (ref 0.2–0.9)
Monocyte %: 4.2 %
Neutrophil #: 8.1 10*3/uL — ABNORMAL HIGH (ref 1.4–6.5)
Neutrophil %: 87 %
Platelet: 95 10*3/uL — ABNORMAL LOW (ref 150–440)
RBC: 2.75 10*6/uL — ABNORMAL LOW (ref 3.80–5.20)
RDW: 15.6 % — ABNORMAL HIGH (ref 11.5–14.5)
WBC: 9.4 10*3/uL (ref 3.6–11.0)

## 2013-12-26 LAB — VANCOMYCIN, TROUGH: VANCOMYCIN, TROUGH: 10 ug/mL (ref 10–20)

## 2013-12-26 LAB — SODIUM: Sodium: 135 mmol/L — ABNORMAL LOW (ref 136–145)

## 2013-12-26 LAB — POTASSIUM: Potassium: 3.3 mmol/L — ABNORMAL LOW (ref 3.5–5.1)

## 2013-12-27 LAB — CREATININE, SERUM
CREATININE: 0.66 mg/dL (ref 0.60–1.30)
EGFR (African American): >60

## 2013-12-27 LAB — POTASSIUM: Potassium: 3.6 mmol/L (ref 3.5–5.1)

## 2013-12-27 LAB — CULTURE, BLOOD (SINGLE)

## 2013-12-28 DIAGNOSIS — I351 Nonrheumatic aortic (valve) insufficiency: Secondary | ICD-10-CM

## 2013-12-28 LAB — SEDIMENTATION RATE: Erythrocyte Sed Rate: 79 mm/hr — ABNORMAL HIGH (ref 0–30)

## 2013-12-28 LAB — WOUND CULTURE

## 2013-12-31 LAB — CULTURE, BLOOD (SINGLE)

## 2014-01-14 ENCOUNTER — Ambulatory Visit: Payer: Self-pay | Admitting: Oncology

## 2014-01-25 ENCOUNTER — Ambulatory Visit: Payer: Self-pay | Admitting: Urgent Care

## 2014-01-27 ENCOUNTER — Ambulatory Visit: Payer: Self-pay | Admitting: Urgent Care

## 2014-01-27 LAB — CBC WITH DIFFERENTIAL/PLATELET
Basophil: 4 %
Eosinophil: 35 %
HCT: 32.8 % — AB (ref 35.0–47.0)
HGB: 11 g/dL — ABNORMAL LOW (ref 12.0–16.0)
Lymphocytes: 28 %
MCH: 33.6 pg (ref 26.0–34.0)
MCHC: 33.6 g/dL (ref 32.0–36.0)
MCV: 100 fL (ref 80–100)
Monocytes: 30 %
Platelet: 143 10*3/uL — ABNORMAL LOW (ref 150–440)
RBC: 3.29 10*6/uL — ABNORMAL LOW (ref 3.80–5.20)
RDW: 15.5 % — ABNORMAL HIGH (ref 11.5–14.5)
Segmented Neutrophils: 3 %
WBC: 2.8 10*3/uL — ABNORMAL LOW (ref 3.6–11.0)

## 2014-01-27 LAB — BASIC METABOLIC PANEL
Anion Gap: 9 (ref 7–16)
BUN: 10 mg/dL (ref 7–18)
CHLORIDE: 100 mmol/L (ref 98–107)
CREATININE: 0.73 mg/dL (ref 0.60–1.30)
Calcium, Total: 8.5 mg/dL (ref 8.5–10.1)
Co2: 24 mmol/L (ref 21–32)
EGFR (Non-African Amer.): >60
Glucose: 86 mg/dL (ref 65–99)
Osmolality: 265 (ref 275–301)
Potassium: 3.5 mmol/L (ref 3.5–5.1)
SODIUM: 133 mmol/L — AB (ref 136–145)

## 2014-01-27 LAB — HEPATIC FUNCTION PANEL A (ARMC)
ALK PHOS: 237 U/L — AB
Albumin: 2.3 g/dL — ABNORMAL LOW (ref 3.4–5.0)
Bilirubin, Direct: 2.3 mg/dL — ABNORMAL HIGH (ref 0.0–0.2)
Bilirubin,Total: 3.1 mg/dL — ABNORMAL HIGH (ref 0.2–1.0)
SGOT(AST): 31 U/L (ref 15–37)
SGPT (ALT): 25 U/L
Total Protein: 6.8 g/dL (ref 6.4–8.2)

## 2014-01-27 LAB — PROTIME-INR
INR: 1.3
Prothrombin Time: 16.2 secs — ABNORMAL HIGH (ref 11.5–14.7)

## 2014-01-27 LAB — TSH: Thyroid Stimulating Horm: 2.86 u[IU]/mL

## 2014-01-27 LAB — ETHANOL: Ethanol: <3 mg/dL

## 2014-01-27 LAB — AMMONIA: AMMONIA, PLASMA: 14 umol/L (ref 11–32)

## 2014-01-28 LAB — AFP TUMOR MARKER: AFP-Tumor Marker: 1.4 ng/mL (ref 0.0–8.3)

## 2014-01-29 ENCOUNTER — Inpatient Hospital Stay: Payer: Self-pay | Admitting: Internal Medicine

## 2014-01-29 LAB — URINALYSIS, COMPLETE
BLOOD: NEGATIVE
Bacteria: NONE SEEN
Glucose,UR: NEGATIVE mg/dL (ref 0–75)
KETONE: NEGATIVE
Leukocyte Esterase: NEGATIVE
Nitrite: NEGATIVE
PROTEIN: NEGATIVE
Ph: 5 (ref 4.5–8.0)
Renal Epithelial: <1
SPECIFIC GRAVITY: 1.013 (ref 1.003–1.030)
Squamous Epithelial: 6
WBC UR: 8 /HPF (ref 0–5)

## 2014-01-29 LAB — PHOSPHORUS: PHOSPHORUS: 2.7 mg/dL (ref 2.5–4.9)

## 2014-01-29 LAB — CBC WITH DIFFERENTIAL/PLATELET
BASOS PCT: 1 %
Basophil #: 0 10*3/uL (ref 0.0–0.1)
EOS PCT: 36.4 %
Eosinophil #: 1.3 10*3/uL — ABNORMAL HIGH (ref 0.0–0.7)
HCT: 35.7 % (ref 35.0–47.0)
HGB: 11.7 g/dL — AB (ref 12.0–16.0)
LYMPHS ABS: 0.7 10*3/uL — AB (ref 1.0–3.6)
Lymphocyte %: 20.4 %
MCH: 33 pg (ref 26.0–34.0)
MCHC: 32.7 g/dL (ref 32.0–36.0)
MCV: 101 fL — AB (ref 80–100)
MONO ABS: 1.3 x10 3/mm — AB (ref 0.2–0.9)
Monocyte %: 38.1 %
NEUTROS ABS: 0.1 10*3/uL — AB (ref 1.4–6.5)
Neutrophil %: 4.1 %
Platelet: 159 10*3/uL (ref 150–440)
RBC: 3.53 10*6/uL — ABNORMAL LOW (ref 3.80–5.20)
RDW: 15.6 % — ABNORMAL HIGH (ref 11.5–14.5)
WBC: 3.5 10*3/uL — AB (ref 3.6–11.0)

## 2014-01-29 LAB — COMPREHENSIVE METABOLIC PANEL
ALK PHOS: 260 U/L — AB
ALT: 21 U/L
Albumin: 2.4 g/dL — ABNORMAL LOW (ref 3.4–5.0)
Anion Gap: 11 (ref 7–16)
BUN: 10 mg/dL (ref 7–18)
Bilirubin,Total: 3.3 mg/dL — ABNORMAL HIGH (ref 0.2–1.0)
CALCIUM: 8.4 mg/dL — AB (ref 8.5–10.1)
Chloride: 99 mmol/L (ref 98–107)
Co2: 23 mmol/L (ref 21–32)
Creatinine: 0.9 mg/dL (ref 0.60–1.30)
EGFR (African American): >60
EGFR (Non-African Amer.): >60
Glucose: 104 mg/dL — ABNORMAL HIGH (ref 65–99)
OSMOLALITY: 266 (ref 275–301)
Potassium: 3.2 mmol/L — ABNORMAL LOW (ref 3.5–5.1)
SGOT(AST): 31 U/L (ref 15–37)
SODIUM: 133 mmol/L — AB (ref 136–145)
Total Protein: 6.8 g/dL (ref 6.4–8.2)

## 2014-01-29 LAB — AMMONIA: Ammonia, Plasma: 79 mcmol/L — ABNORMAL HIGH (ref 11–32)

## 2014-01-29 LAB — MAGNESIUM: MAGNESIUM: 1.4 mg/dL — AB

## 2014-01-29 LAB — PROTIME-INR
INR: 1.4
Prothrombin Time: 16.9 secs — ABNORMAL HIGH (ref 11.5–14.7)

## 2014-01-29 LAB — TROPONIN I: Troponin-I: <0.02 ng/mL

## 2014-01-30 LAB — CBC WITH DIFFERENTIAL/PLATELET
BASOS ABS: 0 10*3/uL (ref 0.0–0.1)
Basophil %: 1.1 %
EOS ABS: 1 10*3/uL — AB (ref 0.0–0.7)
Eosinophil %: 39.9 %
HCT: 26.9 % — ABNORMAL LOW (ref 35.0–47.0)
HGB: 9 g/dL — ABNORMAL LOW (ref 12.0–16.0)
LYMPHS PCT: 16.7 %
Lymphocyte #: 0.4 10*3/uL — ABNORMAL LOW (ref 1.0–3.6)
MCH: 33.4 pg (ref 26.0–34.0)
MCHC: 33.4 g/dL (ref 32.0–36.0)
MCV: 100 fL (ref 80–100)
MONO ABS: 0.8 x10 3/mm (ref 0.2–0.9)
Monocyte %: 33.6 %
NEUTROS PCT: 8.7 %
Neutrophil #: 0.2 10*3/uL — ABNORMAL LOW (ref 1.4–6.5)
PLATELETS: 117 10*3/uL — AB (ref 150–440)
RBC: 2.68 10*6/uL — ABNORMAL LOW (ref 3.80–5.20)
RDW: 15.6 % — AB (ref 11.5–14.5)
WBC: 2.4 10*3/uL — AB (ref 3.6–11.0)

## 2014-01-30 LAB — BASIC METABOLIC PANEL
ANION GAP: 13 (ref 7–16)
BUN: 10 mg/dL (ref 7–18)
CHLORIDE: 108 mmol/L — AB (ref 98–107)
CREATININE: 1.03 mg/dL (ref 0.60–1.30)
Calcium, Total: 7.2 mg/dL — ABNORMAL LOW (ref 8.5–10.1)
Co2: 18 mmol/L — ABNORMAL LOW (ref 21–32)
EGFR (African American): >60
EGFR (Non-African Amer.): 58 — ABNORMAL LOW
Glucose: 104 mg/dL — ABNORMAL HIGH (ref 65–99)
Osmolality: 277 (ref 275–301)
Potassium: 3 mmol/L — ABNORMAL LOW (ref 3.5–5.1)
Sodium: 139 mmol/L (ref 136–145)

## 2014-01-31 LAB — CBC WITH DIFFERENTIAL/PLATELET
BANDS NEUTROPHIL: 10 %
Basophil: 1 %
Eosinophil: 27 %
HCT: 30.2 % — ABNORMAL LOW (ref 35.0–47.0)
HGB: 10 g/dL — ABNORMAL LOW (ref 12.0–16.0)
LYMPHS PCT: 12 %
MCH: 32.9 pg (ref 26.0–34.0)
MCHC: 33.3 g/dL (ref 32.0–36.0)
MCV: 99 fL (ref 80–100)
MONOS PCT: 18 %
Platelet: 116 10*3/uL — ABNORMAL LOW (ref 150–440)
RBC: 3.05 10*6/uL — AB (ref 3.80–5.20)
RDW: 15.7 % — AB (ref 11.5–14.5)
Segmented Neutrophils: 32 %
WBC: 3.4 10*3/uL — AB (ref 3.6–11.0)

## 2014-01-31 LAB — URINE CULTURE

## 2014-01-31 LAB — POTASSIUM: Potassium: 3.4 mmol/L — ABNORMAL LOW (ref 3.5–5.1)

## 2014-01-31 LAB — MAGNESIUM: MAGNESIUM: 1.5 mg/dL — AB

## 2014-02-01 LAB — CBC WITH DIFFERENTIAL/PLATELET
BASOS PCT: 0.9 %
Basophil #: 0 10*3/uL (ref 0.0–0.1)
EOS PCT: 9 %
Eosinophil #: 0.5 10*3/uL (ref 0.0–0.7)
HCT: 32.4 % — AB (ref 35.0–47.0)
HGB: 10.8 g/dL — ABNORMAL LOW (ref 12.0–16.0)
LYMPHS PCT: 12.7 %
Lymphocyte #: 0.7 10*3/uL — ABNORMAL LOW (ref 1.0–3.6)
MCH: 32.9 pg (ref 26.0–34.0)
MCHC: 33.3 g/dL (ref 32.0–36.0)
MCV: 99 fL (ref 80–100)
MONOS PCT: 11 %
Monocyte #: 0.6 x10 3/mm (ref 0.2–0.9)
NEUTROS PCT: 66.4 %
Neutrophil #: 3.5 10*3/uL (ref 1.4–6.5)
Platelet: 163 10*3/uL (ref 150–440)
RBC: 3.27 10*6/uL — ABNORMAL LOW (ref 3.80–5.20)
RDW: 15.3 % — ABNORMAL HIGH (ref 11.5–14.5)
WBC: 5.3 10*3/uL (ref 3.6–11.0)

## 2014-02-01 LAB — BASIC METABOLIC PANEL
ANION GAP: 11 (ref 7–16)
BUN: 20 mg/dL — AB (ref 7–18)
CHLORIDE: 108 mmol/L — AB (ref 98–107)
CO2: 19 mmol/L — AB (ref 21–32)
Calcium, Total: 8.2 mg/dL — ABNORMAL LOW (ref 8.5–10.1)
Creatinine: 1.65 mg/dL — ABNORMAL HIGH (ref 0.60–1.30)
EGFR (Non-African Amer.): 34 — ABNORMAL LOW
GFR CALC AF AMER: 41 — AB
Glucose: 136 mg/dL — ABNORMAL HIGH (ref 65–99)
Osmolality: 280 (ref 275–301)
Potassium: 3.7 mmol/L (ref 3.5–5.1)
SODIUM: 138 mmol/L (ref 136–145)

## 2014-02-01 LAB — MAGNESIUM: Magnesium: 1.9 mg/dL

## 2014-02-01 LAB — AMMONIA: Ammonia, Plasma: 20 mcmol/L (ref 11–32)

## 2014-02-01 LAB — CREATININE, SERUM
Creatinine: 1.63 mg/dL — ABNORMAL HIGH (ref 0.60–1.30)
EGFR (Non-African Amer.): 34 — ABNORMAL LOW
GFR CALC AF AMER: 41 — AB

## 2014-02-01 LAB — VANCOMYCIN, TROUGH
Vancomycin, Trough: 25 ug/mL (ref 10–20)
Vancomycin, Trough: 18 ug/mL (ref 10–20)

## 2014-02-02 ENCOUNTER — Encounter: Payer: Self-pay | Admitting: Internal Medicine

## 2014-02-02 LAB — BASIC METABOLIC PANEL
Anion Gap: 9 (ref 7–16)
BUN: 24 mg/dL — ABNORMAL HIGH (ref 7–18)
CO2: 20 mmol/L — AB (ref 21–32)
Calcium, Total: 8.5 mg/dL (ref 8.5–10.1)
Chloride: 111 mmol/L — ABNORMAL HIGH (ref 98–107)
Creatinine: 1.42 mg/dL — ABNORMAL HIGH (ref 0.60–1.30)
EGFR (Non-African Amer.): 40 — ABNORMAL LOW
GFR CALC AF AMER: 48 — AB
Glucose: 111 mg/dL — ABNORMAL HIGH (ref 65–99)
Osmolality: 284 (ref 275–301)
Potassium: 4.2 mmol/L (ref 3.5–5.1)
Sodium: 140 mmol/L (ref 136–145)

## 2014-02-03 LAB — BASIC METABOLIC PANEL
Anion Gap: 8 (ref 7–16)
BUN: 23 mg/dL — ABNORMAL HIGH (ref 7–18)
CREATININE: 1.29 mg/dL (ref 0.60–1.30)
Calcium, Total: 9 mg/dL (ref 8.5–10.1)
Chloride: 111 mmol/L — ABNORMAL HIGH (ref 98–107)
Co2: 24 mmol/L (ref 21–32)
EGFR (African American): 54 — ABNORMAL LOW
GFR CALC NON AF AMER: 45 — AB
Glucose: 93 mg/dL (ref 65–99)
Osmolality: 288 (ref 275–301)
Potassium: 4.6 mmol/L (ref 3.5–5.1)
Sodium: 143 mmol/L (ref 136–145)

## 2014-02-03 LAB — CBC WITH DIFFERENTIAL/PLATELET
BASOS ABS: 0.1 10*3/uL (ref 0.0–0.1)
Basophil %: 1.3 %
EOS PCT: 16.4 %
Eosinophil #: 1.3 10*3/uL — ABNORMAL HIGH (ref 0.0–0.7)
HCT: 32.4 % — AB (ref 35.0–47.0)
HGB: 10.7 g/dL — ABNORMAL LOW (ref 12.0–16.0)
LYMPHS ABS: 1.7 10*3/uL (ref 1.0–3.6)
LYMPHS PCT: 21.8 %
MCH: 32.6 pg (ref 26.0–34.0)
MCHC: 33.1 g/dL (ref 32.0–36.0)
MCV: 99 fL (ref 80–100)
Monocyte #: 1.1 x10 3/mm — ABNORMAL HIGH (ref 0.2–0.9)
Monocyte %: 14 %
NEUTROS PCT: 46.5 %
Neutrophil #: 3.6 10*3/uL (ref 1.4–6.5)
Platelet: 190 10*3/uL (ref 150–440)
RBC: 3.28 10*6/uL — AB (ref 3.80–5.20)
RDW: 15.9 % — AB (ref 11.5–14.5)
WBC: 7.8 10*3/uL (ref 3.6–11.0)

## 2014-02-03 LAB — CULTURE, BLOOD (SINGLE)

## 2014-02-04 LAB — CREATININE, SERUM
Creatinine: 1.16 mg/dL (ref 0.60–1.30)
EGFR (African American): >60
EGFR (Non-African Amer.): 50 — ABNORMAL LOW

## 2014-02-14 ENCOUNTER — Ambulatory Visit: Payer: Self-pay | Admitting: Oncology

## 2014-02-14 ENCOUNTER — Encounter: Payer: Self-pay | Admitting: Internal Medicine

## 2014-03-15 ENCOUNTER — Encounter
Admit: 2014-03-15 | Disposition: A | Discharge status: Home / Self Care | Payer: Self-pay | Attending: Internal Medicine | Admitting: Internal Medicine

## 2014-03-17 ENCOUNTER — Other Ambulatory Visit: Payer: Self-pay | Admitting: Urgent Care

## 2014-04-15 ENCOUNTER — Encounter
Admit: 2014-04-15 | Disposition: A | Discharge status: Home / Self Care | Payer: Self-pay | Attending: Internal Medicine | Admitting: Internal Medicine

## 2014-05-07 NOTE — H&P (Signed)
PATIENT NAME:  Katie Castro, Katie Castro MR#:  [id number] DATE OF BIRTH:  04/01/1950  DATE OF ADMISSION:  12/24/2013  PRIMARY CARE PROVIDER: Guadalupe Maple, MD   EMERGENCY DEPARTMENT REFERRING PHYSICIAN: Jon Gills. Lord, MD  CHIEF COMPLAINT: Weakness, dizziness, right foot swelling, and redness.   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 She also started feeling very weak and dizzy over the past few days, came to the ED and noted to have significant cellulitis, as well as possible osteomyelitis. Her WBC count is elevated as well. The patient denies any fevers, chills. No chest pain. No shortness of breath. No nausea, vomiting. Does report diagnosis of ascites. Says abdominal distention is decreased since being started on Lasix and spironolactone. The patient also reports that she has been jaundiced for the past 1 month.   PAST MEDICAL HISTORY: 1.  History of alcohol-induced cirrhosis.  2.  History of small bowel obstruction after hysterectomy.   PAST SURGICAL HISTORY: Status post hysterectomy. Right foot surgery for deformity with 2 screws in place.   ALLERGIES: None.   MEDICATIONS AT HOME: She is on spironolactone 50 one tab p.o. b.i.d., Prilosec 20 daily, Lasix 20 daily.   SOCIAL HISTORY: Does not smoke. Used to drink heavy, but has not drank since the diagnosis of liver cirrhosis last month. No drug use.   FAMILY HISTORY: Positive for hypertension.   REVIEW OF SYSTEMS: CONSTITUTIONAL: Complains of weakness, fatigue. Reports 20 pound weight loss since last month due to fluid loss.  HEENT: Denies any blurred vision, double vision. No glaucoma. No cataracts.  Has scleral icterus. No nasal drainage. No epistaxis. No difficulty with hearing. No seasonal or year-round allergies. No hearing trouble or tinnitus.  CARDIOVASCULAR: Denies any chest pain, palpitations, orthopnea. No arrhythmias.  PULMONARY: Denies any cough, wheezing, hemoptysis. No COPD.   GASTROINTESTINAL: Complains of diagnosis of new onset of cirrhosis, ascites. Does have GERD but no IBS. Does complain of jaundice. No hematemesis. No hematochezia.   GENITOURINARY: Denies any frequency, urgency or hesitancy.  ENDOCRINE: Denies any polyuria, nocturia.  HEMATOLOGIC AND LYMPHATIC: Denies easy bruisability or bleeding.  SKIN: Complains of jaundice. No other rash.  NEUROLOGICAL: No CVA, TIA or seizures.  PSYCHIATRIC: No anxiety, insomnia or ADD.  MUSCULOSKELETAL: No gout. No back pain.  LYMPHATICS: Denies any lymph node enlargement.   PHYSICAL EXAMINATION: VITAL SIGNS: Temperature 98.1, pulse 96, respirations 18, blood pressure 120/70, O2 of 99%.  GENERAL: The patient is a chronically ill-appearing female, severely jaundiced in no acute distress.  HEENT: Head atraumatic, normocephalic. Pupils equally round, reactive to light and accommodation. There is no conjunctival pallor. Has scleral icterus. Extraocular movements intact. Nasal exam shows no drainage or ulceration. No erythema. Oropharynx is clear without any exudate. Ear exam shows no erythema or drainage.  NECK: Supple without any thyromegaly.  CARDIOVASCULAR: Regular rate and rhythm. No murmurs, rubs, clicks, or gallops.  LUNGS: Clear to auscultation bilaterally without any rales, rhonchi, wheezing.  ABDOMEN: Distended, but not tense. Positive bowel sounds x 4. No hepatosplenomegaly appreciated. No guarding. No rebound.  EXTREMITIES: She has right foot swelling. On the plantar aspect of her foot she has an area of  a small ulceration where some fluid can be drained. She has erythema involving the foot and swelling up to her ankles. There  is pitting edema on that side as well. Warm to touch. On the left leg there is no swelling or erythema.  LYMPH NODES: Nonpalpable.  MUSCULOSKELETAL: There is no erythema or swelling in the joints.  NEUROLOGIC: Cranial nerves II through XII grossly intact. No focal deficits.  PSYCHIATRIC: Not anxious or depressed.  VASCULAR: Good DP/PT pulses.   EVALUATIONS: Glucose 172, BUN 17, creatinine 0.69, sodium 128, potassium 3.8, chloride 97, CO2 of 20, calcium 8.3. LFTs: Total protein 7.1, albumin 2.3, bilirubin total 12.9, bilirubin direct 10.3, alkaline phosphatase 241, AST 85, ALT 97. WBC 17, hemoglobin 10.5, platelet count 112,000. INR 1.6. Right foot x-ray shows findings consistent with osteomyelitis involving the first metatarsal.   ASSESSMENT AND PLAN: The patient is a 64 year old with the recent diagnosis of liver cirrhosis, presents with weakness, dizziness, right foot swelling, and pain.  1.  Right foot swelling and pain due to acute cellulitis, osteomyelitis. At this time, we will treat her with IV vancomycin and Zosyn. Podiatry consult. The patient may need further surgery and debridement.  2.  Liver cirrhosis has been followed by Vickey Huger. Monitor LFTs. Does not drink anymore in the past 1 month.  3.  Hyponatremia in the setting of liver cirrhosis third spacing. Monitor her sodium level.  4.  Ascites. Continue Lasix and spironolactone.  5.  Anemia, likely anemia of chronic disease.  6.  Hyperglycemia. We will check a hemoglobin A1c, monitor her blood sugar.  7.  Deep venous thrombosis prophylaxis. The patient is ambulatory. In light of her elevated INR we will not do any anticoagulation. In light of her infection, we will not be able to put TED hose.   TIME SPENT ON THIS PATIENT: 60 minutes.    ____________________________ Lafonda Mosses Posey Pronto, MD shp:at D: 12/24/2013 14:12:20 ET T: 12/24/2013 14:53:29 ET JOB#: [id number]  cc: Bellamia Ferch H. Posey Pronto, MD, <Dictator> Alric Seton  MD ELECTRONICALLY SIGNED 12/29/2013 10:40

## 2014-05-07 NOTE — Discharge Summary (Signed)
PATIENT NAME:  Katie Castro, Katie Castro MR#:  [id number] DATE OF BIRTH:  Jul 28, 1950  DATE OF ADMISSION:  12/24/2013 DATE OF DISCHARGE:  12/28/2013  ADMITTING DIAGNOSIS: Right foot cellulitis as well as questionable osteomyelitis.   DISCHARGE DIAGNOSES:  1.  Methicillin-sensitive Staphylococcus aureus sepsis. Right foot abscess and cellulitis status post incision and drainage of right foot and removal of hardware from first metatarsocuneiform region on 12/25/2013 by Dr. Elvina Mattes.  2.  Hyponatremia.  3.  Liver cirrhosis with ascites.   4.  Hyperglycemia with hemoglobin A1c 4.5, no diabetes.  5.  Generalized weakness and right foot pain.   DISCHARGE CONDITION: Stable.   DISCHARGE MEDICATIONS: The patient is to continue spironolactone 50 mg p.o. twice daily, Lasix 10 mg p.o. daily, Prilosec 10 mg p.o. daily, oxycodone 5 mg every 4 hours as needed, Zosyn 3.375 grams every 8 hours IV for the next 2 weeks or longer depending on Dr. Blane Ohara recommendations. The patient was also advised to continue prednisone per prior recommendations.   DISCHARGE INSTRUCTIONS: The patient is being discharged at home health physical therapy as well as nurse. Home health instructions right foot wet-to-dry dressing change daily, place gauze on dressings, extra padding, splint, and wrapping to the right foot and leg. Physical therapy: Partial weight-bearing to right foot, ambulate with a walker. Dressing care: Keep dressing dry.   HOME OXYGEN: None.   DIET: Two grams salt. Diet consistency: Regular.   ACTIVITY LIMITATIONS: As tolerated as above.   REFERRAL: To home health PT as well as R.N.   FOLLOWUP APPOINTMENTS: With Dr. Jeananne Rama 2 days after discharge, Dr. Ola Spurr in 1 week after discharge, Dr. Elvina Mattes on 01/04/2014 at 11:00 a.m. in his office.    CONSULTANTS: Care management, social work, Dr. Ola Spurr, Dr. Elvina Mattes, nurse practitioner Ms. Vickey Huger.    RADIOLOGIC STUDIES: Right foot complete x-ray  revealed osteomyelitis involving the first metatarsal nonunion of apparent attempt at fusion of the medial cuneiform and first metatarsal, according to radiology. Chest portable single view 12/28/2013, new right upper extremity PICC line with the tip in the mid SVC, upper limits of normal heart size without acute cardiopulmonary disease. Echocardiogram 12/28/2013, revealing challenging images, no clear valve vegetation noted. If clinically indicated consider TEE. Left ventricular ejection fraction by visual estimation 60%-65%, normal global left ventricular systolic function, mildly increased left ventricular internal cavity size, moderately dilated left atrium, mild aortic regurgitation,  mild aortic valve sclerosis without stenosis, normal right ventricular systolic pressure.   HOSPITAL COURSE: The patient is a 64 year old Caucasian female with history of alcoholic liver cirrhosis who presents to the hospital with complaints of weakness, dizziness, right foot swelling as well as redness. Please refer to Dr. Chana Bode Patel's admission note on 12/24/2013. On arrival to the hospital, the patient's temperature was 98.1, pulse was 96, respiration rate was 18, blood pressure 120/70, saturation was 99% on room air. Physical exam revealed icteric sclera and right foot swelling, ulceration and fluid was drained on plantar aspect of her foot, erythema was noted involving foot and swelling of both ankles. Pitting edema was noted on the lateral side. It was warm to touch. The left leg did not show any significant abnormalities. The patient's lab data done on arrival to the hospital 12/24/2013 revealed glucose level 172, sodium 128, CO2 level was only 20. The patient's ammonia level was less than 10. Hemoglobin A1c was 4.5. Liver enzymes revealed albumin level of 2.3, total bilirubin was 12.9, direct bilirubin 10.3, alkaline phosphatase 241, AST 85, and ALT  197. White blood cell count was 17.0, hemoglobin was 10.5, platelet  count was 112,000, absolute neutrophil count was elevated at 16.1. Coagulation panel: Pro time was 18.8 and INR was 1.6. Blood cultures taken on 12/24/2013 showed Staphylococcus aureus methicillin-sensitive, resistant to tetracycline, sensitive to all other antibiotics. The patient did have thoracentesis done on 12/24/2013, which revealed many gram-positive cocci in pairs, heavy growth. Wound culture taken on 12/24/2013 showed growth of Staphylococcus aureus moderate growth of Enterococcus faecalis, moderate anaerobic bacteria, beta-lactamase negative. The patient was initiated on broad-spectrum antibiotic therapy with vancomycin and Zosyn. When her cultures came back positive for Staphylococcus. Her repeated cultures were taken on 12/26/2013 and both of them were negative after 48 hours. The patient was consulted by Dr. Ola Spurr who felt the patient should continue antibiotic therapy with Zosyn; treatment course will be determined according to Dr. Blane Ohara recommendations. The patient had echocardiogram done, which showed no significant vegetations. She had a PICC line placed on 12/28/2013 forty-eight hours after negative blood cultures from 12/26/2013. Dr. Ola Spurr felt that the patient should continue antibiotic therapy for now for 2 weeks minimum course for bacteremia and then he felt that this patient is improving that she may transition to oral antibiotics to complete the course until foot is a healed. He recommended weekly labs as well. Dr. Ola Spurr filled out antibiotic form as well as a prescription for Zosyn was given to case manager. The patient will be discharged to home with home health, physical therapy as well as nurse to follow her along as outpatient. Wound care of was recommended per Dr. Selina Cooley recommendations. The patient is also to follow up with Dr. Elvina Mattes to follow her wound healing. The patient is to continue pain management with opiates.  Hyponatremia was felt to be related to  fluid overload due to liver cirrhosis. The patient was continued on spironolactone as well as Lasix with improvement of her sodium level to 135 on 12/26/2013. In regard to mild hypokalemia, patient's potassium level noted to be is a 3.3 on 12/26/2013. Potassium was supplemented and then improved by the day of discharge. In regard to jaundice, the patient's jaundiced improved with treatment of her sepsis. In regard to hyperglycemia, the patient's hemoglobin A1c was checked and was found to be 4.5. It was felt to be no diabetes, but stress related. For right foot pain and generalized weakness, the patient is to continue physical therapy as previously recommended. The patient is being discharged in stable condition with the above-mentioned medications and followup. On the day of discharge, temperature was 97.7, pulse was 58-76, respiration rate was 18-20, blood pressure 235-361/44R diastolic, and oxygen saturation was 97% on room air at rest.   TIME SPENT: 40 minutes.    ____________________________ Theodoro Grist, MD rv:bm D: 12/28/2013 20:46:17 ET T: 12/29/2013 03:28:37 ET JOB#: [id number]  cc: Theodoro Grist, MD, <Dictator> Guadalupe Maple, MD Cheral Marker. Ola Spurr, MD Gerrit Heck Troxler, DPM Giannie Soliday MD ELECTRONICALLY SIGNED 01/13/2014 17:04

## 2014-05-07 NOTE — Consult Note (Signed)
Brief Consult Note: Diagnosis: abcess and celluolitis right foot secondary to 1st met. scres prominence.   Patient was seen by consultant.   Consult note dictated.   Recommend to proceed with surgery or procedure.   Recommend further assessment or treatment.   Orders entered.   Discussed with Attending MD.   Comments: Screw from 1st met has backed and this likely caused skin abrasion and resultant open sore and infection.  This needs debridement and removal of screw.  Electronic Signatures: Perry Mount (MD)  (Signed 11-Dec-15 15:50)  Authored: Brief Consult Note   Last Updated: 11-Dec-15 15:50 by Perry Mount (MD)

## 2014-05-07 NOTE — Consult Note (Signed)
Brief Consult Note: Comments: Ms. Koplin is well known to me as I follow her outpatient for ETOH hepatitis/cirrhosis.  I stopped by today to see her after admission as she had called my office this AM.  After discussion with Dr. Allen Norris & Dr Elvina Mattes it is felt that benefits of continued use of prednisolone for her ETOH hepatitis outweight risks of infection at this time, however this will need to be closely monitored in the upcoming days to see how she responds to antibiotics.  I spoke with Dr Tressia Miners who will advise pt to bring Prednisolone from home to take 40mg  daily.  She will follow up with me or Dr Allen Norris outpatient as planned, but do not hesitate to call with any questions or concerns regarding her liver disease.  Electronic Signatures: Andria Meuse (NP)  (Signed 11-Dec-15 18:48)  Authored: Brief Consult Note   Last Updated: 11-Dec-15 18:48 by Andria Meuse (NP)

## 2014-05-09 ENCOUNTER — Other Ambulatory Visit: Payer: Self-pay | Admitting: Urgent Care

## 2014-05-09 DIAGNOSIS — K769 Liver disease, unspecified: Secondary | ICD-10-CM

## 2014-05-11 NOTE — Consult Note (Signed)
PATIENT NAME:  Katie Castro, Katie Castro MR#:  [id number] DATE OF BIRTH:  [birth year]  DATE OF CONSULTATION:  12/24/2013  REFERRING PHYSICIAN:   CONSULTING PHYSICIAN:  Gerrit Heck. Jurni Cesaro, DPM  REASON FOR CONSULTATION: Cellulitis, redness, infection to the right foot.   HISTORY OF PRESENT ILLNESS: The patient is a 64 year old Caucasian female who states she had a fusion of the first metatarsocuneiform joint to correct a severe hallux valgus and overlapping hammertoe deformity back in May. This was done by Dr. Wylene Simmer in [place]. She states she was nonweightbearing for several months following the surgery. She has been ambulatory since then. She came to the Emergency Room with weakness, increased pain in her foot, redness to the foot, and dizziness. She has a medical history/recent diagnosis of the liver cirrhosis due to alcohol abuse. Her medical history also includes small bowel obstruction after hysterectomy.   Regarding her alcohol abuse, she states that she has not consumed any alcohol since a month ago when she got the diagnosis of cirrhosis.   ALLERGIES: None.  CURRENT HOME MEDICATIONS INCLUDE: Spironolactone 50 mg, Prilosec 20 mg, and Lasix 20 mg.   SOCIAL HISTORY: Denies smoking. As noted, she was a heavy drinker, but has discontinued as of a month ago.   FAMILY HISTORY: Positive for hypertension.   REVIEW OF SYSTEMS:  GENERAL APPEARANCE: The patient is a pleasant 64 year old female who is alert and appears to be well oriented. Her skin is notably jaundiced at this point.  HEENT: Within normal limits, except for scleral icterus and significant yellowing LYMPHATICS: She denies any lymph node enlargement or pain in her head or neck area.  GASTROINTESTINAL: The patient does have ascites. A history of GERD, but no IBS.   PHYSICAL EXAMINATION:  VITAL SIGNS AT THIS POINT INCLUDE: Temperature of 98, pulse 79, respirations 18, blood pressure is 119/70, and pulse oximetry is 97%.  LOWER  EXTREMITIES: Vascular: DP pulses are +1/4 in the left foot; on the right foot, they are a little difficult to palpate, due to the amount of swelling that she has. Dermatologic: There is a wound on top of the right foot, over the midshaft area of the first metatarsal region. There is noted swelling and redness to the foot. The wound is about 8 mm in diameter, but with compression of the dorsum of the foot noted. Purulent drainage is emanating from the area. Also, some evidence of clotted blood, consistent with a hematoma is noted in the region. Old scar lines are noted on the dorsum of the first metatarsophalangeal joint and the first metatarsocuneiform region. As noted, significant swelling and redness is present. The patient appears to be somewhat neuropathic. She does not feel much pain with this compression.   IMAGING: X-rays reviewed today show the distal screw, which has been placed across the first metatarsocuneiform joint fusion has backed up significantly and is lying underneath the dermal tissues. There is another screw running from proximal dorsal to plantar distal across the first metatarsocuneiform region. This appears to be more intact. Another screw is present across the proximal phalanx and across the second metatarsal. These appear to be stable. There is no significant lucency with the screw that is prominent at this timeframe. Screw head appears to underlie the ulcerative area on the x-ray.   CLINICAL IMPRESSION: Cellulitis/abscess right foot, with underlying complications associated with screw backing out of the first metatarsal on the dorsum of the foot. 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  PLAN: At bedside, I made a small incision through the wound so I better express purulence and clotted blood from the area. After reviewing the x-rays, I feel like we need to get the looser first metatarsal screw out, in order to avoid further pressure irritation. I think  this needs to be cleaned up and debrided and irrigated copiously as well as keeping her on antibiotics. I did do a culture on the area today, and we will try to get results back on that as well. I will try to set her up for surgery tomorrow morning in the hospital here, and she will be n.p.o. at midnight tonight.    ____________________________ Gerrit Heck. Libero Puthoff, DPM mgt:MT D: 12/24/2013 15:46:52 ET T: 12/24/2013 16:55:24 ET JOB#: [id number]  cc: Gerrit Heck Rechel Delosreyes, DPM, <Dictator> Perry Mount MD ELECTRONICALLY SIGNED 01/27/2014 12:51

## 2014-05-11 NOTE — Op Note (Signed)
PATIENT NAME:  Katie Castro, Katie Castro MR#:  [id number] DATE OF BIRTH:  06-21-1950  DATE OF PROCEDURE:  12/25/2013  PREOPERATIVE DIAGNOSIS: Cellulitis, abscess right foot with complication from hardware (2 buried screws across a nonunion of the first metatarsocuneiform joint).  POSTOPERATIVE DIAGNOSIS: Cellulitis, abscess right foot with complication from hardware (2 buried screws across a nonunion of the first metatarsocuneiform joint).    PROCEDURE: Incision and drainage of abscess with debridement of infected tissue and removal of loosened hardware from the first metatarsal cuneiform joint.   SURGEON: Gerrit Heck. Carolan Avedisian, DPM   ASSISTANT: None.   ANESTHESIA: MAC with local.   ANESTHESIOLOGIST: Alvin Critchley, MD  ESTIMATED BLOOD LOSS: Approximately 20 mL.   HEMOSTASIS: 250 mm tourniquet at the ankle.   INDICATION FOR PROCEDURE: The patient underwent a surgery back in May in [place] for a fusion of the first metatarsal cuneiform joint. She also had osteotomy of the second metatarsal and hallux valgus repair. The patient stated that she was nonweightbearing for 2-3 months, but recently started getting more swelling pain and discomfort to the region. She was admitted to the hospital yesterday because of weakness and pain, and I evaluated her at bedside and noted an abscess dorsally, which drained a significant amount of pus and also clotted blood from the area yesterday at bedside. It felt like the area needed to be cleaned out. X-rays showed significantly backed out screw, a nonunion to the first metatarsocuneiform fusion site, and a loose screw proximally as well. At this point, I felt like in order to resolve her infection, the screws needed to be removed to reduce the risk of osteomyelitis to the region and also to clean up the infected area.   DESCRIPTION OF PROCEDURE: The patient was brought to the OR and placed on the OR table in the supine position. At this point after local blocks were  given around the ankle by myself, and sedation was achieved the anesthesia team, the patient was then prepped and draped in the usual sterile manner. At this time, attention was directed to the dorsum of the right foot where a 1 cm wound was noted over the first metatarsal mid-to-distal shaft area. This had been opened and probed and drained yesterday at bedside. At this point, an incision was made proximally in front of this approximately 5 cm, and 2 cm distal to this. The incision was deepened with sharp and blunt dissection. Some infected tissue was noted, particularly around the ulcer site, but not proximally. Distally, it looked clean as well. Most of the infectious abscess appeared to be more superficial. There was old hematoma present going all the way down to the bone level. A very prominent screw was noted soon after the initial incision, underneath the ulcerative area. This was backed out easily. The incision was deepened proximally. The nonunion site was encountered, and the proximal screw was identified and removed. After copious irrigation, and also debridement of questionable tissues with the VersaJet, the area was copiously irrigated and flushed, and periosteal and capsular tissue was closed with 3-0 Vicryl in a continuous stitch. Deep and superficial fascial layers were closed with 3-0 Vicryl in a continuous stitch and skin closed with 3-0 nylon horizontal mattress in simple interrupted combination. The area of the previous ulceration was partially closed, but left open enough to pack to allow for drainage from the area and secondary intention healing at that particular spot. The tourniquet had been released a couple of times during the procedure to check for  bleeders, and these were cauterized as necessary and tied off as necessary. The foot was then dressed in a sterile compressive dressing consisting of 4x4's, ABD pads, Conform, and Kling. A posterior splint was placed on the right foot and leg in  the OR. The patient left the OR for the recovery room with vital signs stable and neurovascular status intact.    ____________________________ Gerrit Heck. Ariellah Faust, DPM mgt:MT D: 12/25/2013 09:35:39 ET T: 12/25/2013 14:15:56 ET JOB#: [id number]  cc: Gerrit Heck Carold Eisner, DPM, <Dictator> Perry Mount MD ELECTRONICALLY SIGNED 01/27/2014 12:51

## 2014-05-11 NOTE — Consult Note (Signed)
PATIENT NAME:  Katie Castro, Katie Castro MR#:  [id number] DATE OF BIRTH:  [birth year]  DATE OF CONSULTATION:  12/27/2013  REFERRING PHYSICIAN:  Theodoro Grist, MD CONSULTING PHYSICIAN:  Cheral Marker. Ola Spurr, MD  REASON FOR CONSULTATION: Methicillin sensitive Staphylococcus aureus bacteremia and foot infection.   HISTORY OF PRESENT ILLNESS: This is a very pleasant 64 year old female, previously in good health but with a recent diagnosis of cirrhosis, who had surgery on her right foot several months ago to correct a deformity. She developed an ulcer over the top of her foot. This had progressed and she was having increasing pain and redness in the foot. She also felt quite weak and dizzy. The patient was admitted and found to have an elevated white count, evidence of possible osteomyelitis. She has grown MSSA from blood cultures. She has had surgery by Dr. Delos Haring with removal of 2 screws that were in place, and incision and drainage and washout. She has been treated with vancomycin and Zosyn.   PAST MEDICAL HISTORY:  1.  Cirrhosis, alcohol induced, newly diagnosed.  2.  History of small bowel obstruction after hysterectomy.   PAST SURGICAL HISTORY: Hysterectomy and surgery for right foot deformity, as above.   SOCIAL HISTORY: Does not smoke. Used to drink heavily, but none now. Works in business. No drug use.   FAMILY HISTORY: Noncontributory.   REVIEW OF SYSTEMS: Eleven systems re reviewed and negative, except as per HPI.   ALLERGIES: No known drug allergies.   ANTIBIOTICS SINCE ADMISSION INCLUDE: Vancomycin and Zosyn; currently switched to ceftriaxone.   PHYSICAL EXAMINATION:  VITAL SIGNS: Temperature 97.6, pulse 68, blood pressure 144/81, respirations 18, saturation 96% on room air.  GENERAL: She is pleasant, interactive, in no acute distress.  HEENT: Pupils are equal, round, and reactive to light and accommodation. Extraocular movements are intact. Sclerae are anicteric. Oropharynx is  clear.  NECK: Supple.  HEART: Regular.  LUNGS: Clear to auscultation bilaterally.  ABDOMEN: Soft, slightly distended, nontender. No hepatosplenomegaly.  EXTREMITIES: Her right foot is wrapped postoperatively.  NEUROLOGIC: She is alert and oriented x 3.   LABORATORY DATA: White blood cell count on admission, 12/22/2013, was 17.0, hemoglobin 10.5, platelets 112,000. Currently white count 9.4, hemoglobin 10.1, platelets 95,000. Renal function shows a creatinine of 0.66. LFTs show an elevated bilirubin 12.9 on 12/24/2013  and 10.4 on 12/25/2013. Alkaline phosphatase is 199, AST 65, ALT 84. INR was 1.6.   CULTURES: Blood cultures 12/24/2013 have 2:2 methicillin sensitive Staphylococcus aureus, pan sensitive. Culture from the foot 12/24/2013 have grown heavy growth of Staph aureus, moderate growth of gram-positive cocci, holding for possible anaerobes. Follow-up blood cultures 12/26/2013 are no growth to date.   IMAGING: X-ray of the foot 12/24/2013 showed osteomyelitis involving the first metatarsal and nonunion of the apparent attempted fusion of the medial cuneiform and first metatarsal.   IMPRESSION: A 64 year old previously healthy female with a history of the foot surgery several months ago, newly diagnosed with cirrhosis, admitted 12/24/2013 with sepsis due to an infected right foot. She has grown methicillin sensitive Staph aureus in blood cultures from 12/24/2013, but has cleared since 12/26/2013. She has been treated with vancomycin and Zosyn. She has undergone surgery 12/25/2013 by Dr. Albertine Patricia with removal of infected screws and washout. Cultures from that are pending. Clinically, she is much improved.   RECOMMENDATIONS:  1.  Check echocardiogram, given MSSA bacteremia.  2.  If echo is negative we can defer on TEE, as she is going to receive a prolonged course of  antibiotics directed at the Staphylococcus aureus for the foot.  3.  Place PICC line, once blood cultures are negative x 48  hours. I have switched her from ceftriaxone back to Zosyn. Given her liver disease, ceftriaxone  could cause some biliary sludging, and it would be difficult to monitor, given her underlying cirrhosis.  4.  Final antibiotic recommendations would be based on final culture results.   Thank you for the consult. I will be glad to follow with you.    ____________________________ Cheral Marker. Ola Spurr, MD dpf:MT D: 12/27/2013 15:35:04 ET T: 12/27/2013 16:24:58 ET JOB#: [id number]  cc: Cheral Marker. Ola Spurr, MD, <Dictator> Adell Koval Ola Spurr MD ELECTRONICALLY SIGNED 01/16/2014 21:22

## 2014-05-15 NOTE — Consult Note (Signed)
Patient's white blood cell count and platelet count have now returned to normal. This is most likely medication induced, possibly Zosyn. No further hematologic intervention is needed. Appreciate consult, call with questions.  Electronic Signatures: Delight Hoh (MD)  (Signed on 21-Jan-16 11:59)  Authored  Last Updated: 21-Jan-16 11:59 by Delight Hoh (MD)

## 2014-05-15 NOTE — Consult Note (Signed)
PATIENT NAME:  Katie Castro, Katie Castro MR#:  [id number] DATE OF BIRTH:  09-09-1950  DATE OF CONSULTATION:  01/31/2014  REFERRING PHYSICIAN:  Abel Presto, MD CONSULTING PHYSICIAN:  Cheral Marker. Ola Spurr, MD  REASON FOR CONSULTATION: Neutropenic fever, rash and osteomyelitis.   HISTORY OF PRESENT ILLNESS: This is a very pleasant 64 year old female previously relatively healthy who had an admission December 11th through the 15th due to right foot osteomyelitis. She also had methicillin-sensitive Staphylococcus aureus bacteremia. The patient underwent surgery December 12th by Dr. Elvina Mattes with removal of infected screws and washout with cultures growing MSSA and enterococcus. The patient was discharged with a PICC in place. She was on Zosyn. She had outpatient weekly labs done, which were relatively stable until last week when she developed a profound neutropenia with an absolute neutrophil count of zero, while on the Zosyn. On the 15th, she was seen in clinic. I pulled her PICC line, stopped her Zosyn and started oral levofloxacin. She completed a 4 week course of IV Zosyn. It was felt her neutropenia was likely due to the Zosyn.   The patient states she did well for a few days; however, around the 16th one of her caregivers noticed a rash. This progressed and became very itchy. She also was having fevers and chills and worsening redness of the right foot. She was admitted, found to be febrile and had persistent neutropenia. She was started on vancomycin and Zosyn. She has had x-ray of her foot and has been seen by podiatry who does not feel that the foot is infected as the source in the ankle. However, she does have a new heel ulcer with eschar.   PAST MEDICAL HISTORY: 1.  Alcoholic cirrhosis, newly diagnosed, being worked up for other etiologies.  2.  Small bowel obstruction after hysterectomy.   PAST SURGICAL HISTORY: Hysterectomy, right foot surgery in the past with screw placement and then removal in  December 2015 due to poor healing as well as infection.   SOCIAL HISTORY: She lives alone. No illicit drugs or smoking. She had drank relatively heavy prior to this most recent admission in December and diagnosis of cirrhosis.   FAMILY HISTORY: Positive for hypertension.   ALLERGIES: LEVOFLOXACIN has been added to the list. No prior drug allergies. Zosyn is likely the cause of her neutropenia.   ANTIBIOTICS SINCE ADMISSION: Vancomycin and Zosyn. Her levofloxacin has been held.   REVIEW OF SYSTEMS: Eleven systems reviewed and negative, except as per HPI.   PHYSICAL EXAMINATION: VITAL SIGNS: Temperature 102.8 yesterday, today is 98.2, pulse 84, blood pressure 102/64, respirations 18, sat 96%.  GENERAL: She is pleasant, interactive, somewhat slowed thinking.  HEENT: Pupils are reactive. Mild icterus. Oropharynx clear with no lesions.  NECK: Supple.  HEART: Regular.  LUNGS: Clear.  ABDOMEN: Soft, nontender, nondistended.  EXTREMITIES: Her right foot is wrapped. She has a dorsal incision site, which is well approximated. There is minimal tenderness there. There is no drainage, erythema or induration. She has a healed black eschar on the right side as well. Minimally tender.  NEUROLOGIC: She is awake, interactive, alert, somewhat slowed mentation.   DIAGNOSTIC DATA: White blood count on admission was 2.4 with a diff with 0.2 neutrophils, 0.4 lymphocytes, 1 eosinophils. Blood cultures January 16th negative x2. UA and urine culture negative. Lactic acid on the 16th was 2.7, down to 2. LFTs showed bilirubin of 3.3, AST and ALT were normal, alk phos 260. Renal function shows a creatinine of 1.03, up from 0.9.  Imaging: Foot x-ray shows possible osteomyelitis of the first metatarsal, of the medial cuneiform.   Chest x-ray showed stable cardiomegaly. No acute cardiopulmonary disease.   IMPRESSION: A 64 year old with newly diagnosed cirrhosis as well as recent methicillin-sensitive Staphylococcus  aureus and enterococcal infection of her right foot status post surgery by Dr. Elvina Mattes on December 12th. She also had methicillin-sensitive Staphylococcus aureus bacteremia at that time. She had a PICC placed and was treated with 4 weeks of IV Zosyn, which was stopped last Wednesday, on the 15th, in the setting of new profound neutropenia. PICC was pulled at that time and she was switched to oral levofloxacin. She then presented with a diffuse rash, likely from the levofloxacin. She has eosinophilia as well. Her neutrophils still remain quite low at 0.2. She has had a fever to 102.8. The right foot appears relatively well healed and Dr. Elvina Mattes does not feel upon review of the imaging in the case that there is persistent ongoing osteomyelitis in the right foot. She does have a heel eschar that will need debridement.   Given her neutropenic fever, I am concerned and will certainly need to cover her. The rash is again likely due to the levofloxacin, especially with the eosinophilia, and this has been discontinued.   RECOMMENDATIONS: 1.  Change antibiotics from Zosyn to meropenem.  2.  Continue vancomycin.  3.  Consult oncology and hematology for the neutropenia to see if we should give her Neupogen.  4.  Can give a short steroid taper for the drug rash.  5.  Would defer debridement of the heel for a day or two until her counts stabilize.   Thank you for the consult. I will be glad to follow with you.   ____________________________ Cheral Marker. Ola Spurr, MD dpf:sb D: 01/31/2014 13:27:04 ET T: 01/31/2014 13:52:04 ET JOB#: [id number]  cc: Cheral Marker. Ola Spurr, MD, <Dictator> Navayah Sok Ola Spurr MD ELECTRONICALLY SIGNED 02/09/2014 21:00

## 2014-05-15 NOTE — Op Note (Signed)
PATIENT NAME:  Katie Castro, Katie Castro MR#:  [id number] DATE OF BIRTH:  1950/08/06  DATE OF PROCEDURE:  02/03/2014  PREOPERATIVE DIAGNOSIS: Decubitus ulceration posterior right heel.   POSTOPERATIVE DIAGNOSIS: Decubitus ulceration posterior right heel.   PROCEDURE: Debridement of decubitus ulcer with Apligraf application right heel.   SURGEON: Gerrit Heck. Hitesh Fouche, DPM.   ASSISTANT: None.   HISTORY OF PRESENT ILLNESS: The patient has had some chronic illness over the last month and a half to 2 months, has not been able to walk and ambulate as much as she normally does and she developed a blister and subsequent ulceration on the back of the right heel. She has been somewhat immobilized because of another foot problem on the midfoot region that we are treating as well. The wound appeared to have an eschar and necrosis centrally with that eschar, but did not appear to be infected.   ANESTHESIA: MAC with local anesthesia.   ANESTHESIOLOGIST:  Dr. Boston Service.   ESTIMATED BLOOD LOSS: 10 mL.   OPERATIVE REPORT: The patient was brought to the OR and placed on the OR table in the lateral position on her left side. The patient was then prepped and draped in the usual sterile manner after I injected the heel with 0.5% Marcaine. The patient was then prepped and draped in the usual sterile manner and then attention was directed to the posterior heel following a timeout. The thicker denser portions of the eschar were removed with a 15 blade and then a VersaJet set at 3 was utilized to debride the remaining devitalized necrotic tissue. The wound did penetrate down to the fatty tissue. Medial aspect of the wound was a little deeper than the left. There was no bone exposure, there were no sinus tracts. After good bleeding was achieved the area was dressed with an Apligraf application. The wound was measured at 3 x 2.5 cm. The Apligraf was placed across the area and secured with multiple Steri-Strips. At this point a  sterile compressive dressing was placed across the area consisting of Tegaderm, a moist saline gauze, 4 x 4s, ABD pads, Kerlix, and an Ace wrap. Care was taken on the dorsal portion of the foot where there were some skin abrasions from previous wraps and an ABD pad protection was placed on each side of those regions. The patient tolerated the procedures and anesthesia well and left the OR for the recovery room with vital signs stable and neurovascular status intact.   ____________________________ Gerrit Heck. Debi Cousin, DPM mgt:bu D: 02/03/2014 12:45:00 ET T: 02/03/2014 13:12:18 ET JOB#: [id number]  cc: Gerrit Heck Domonic Hiscox, DPM, <Dictator> Perry Mount MD ELECTRONICALLY SIGNED 02/21/2014 12:43

## 2014-05-15 NOTE — Op Note (Signed)
PATIENT NAME:  Katie Castro, Katie Castro MR#:  [id number] DATE OF BIRTH:  24-May-1950  DATE OF PROCEDURE:  02/02/2014  PREOPERATIVE DIAGNOSIS: Decubitus ulceration posterior right heel.   POSTOPERATIVE DIAGNOSIS: Decubitus ulceration posterior right heel.   PROCEDURE: Debridement of decubitus ulcer with Apligraf application right heel.   SURGEON: Gerrit Heck. Coolidge Gossard, DPM.   ASSISTANT: None.   HISTORY OF PRESENT ILLNESS: The patient has had some chronic illness over the last month and a half to 2 months, has not been able to walk and ambulate as much as she normally does and she developed a blister and subsequent ulceration on the back of the right heel. She has been somewhat immobilized because of another foot problem on the midfoot region that we are treating as well. The wound appeared to have an eschar and necrosis centrally with that eschar, but did not appear to be infected.   ANESTHESIA: MAC with local anesthesia.   ANESTHESIOLOGIST:  Dr. Boston Service.   ESTIMATED BLOOD LOSS: 10 mL.   OPERATIVE REPORT: The patient was brought to the OR and placed on the OR table in the lateral position on her left side. The patient was then prepped and draped in the usual sterile manner after I injected the heel with 0.5% Marcaine. The patient was then prepped and draped in the usual sterile manner and then attention was directed to the posterior heel following a timeout. The thicker denser portions of the eschar were removed with a 15 blade and then a VersaJet set at 3 was utilized to debride the remaining devitalized necrotic tissue. The wound did penetrate down to the fatty tissue. Medial aspect of the wound was a little deeper than the left. There was no bone exposure, there were no sinus tracts. After good bleeding was achieved the area was dressed with an Apligraf application. The wound was measured at 3 x 2.5 cm. The Apligraf was placed across the area and secured with multiple Steri-Strips. At this point a  sterile compressive dressing was placed across the area consisting of Tegaderm, a moist saline gauze, 4 x 4s, ABD pads, Kerlix, and an Ace wrap. Care was taken on the dorsal portion of the foot where there were some skin abrasions from previous wraps and an ABD pad protection was placed on each side of those regions. The patient tolerated the procedures and anesthesia well and left the OR for the recovery room with vital signs stable and neurovascular status intact.   ____________________________ Gerrit Heck. Camelia Stelzner, DPM mgt:bu D: 02/03/2014 12:45:00 ET T: 02/03/2014 13:12:18 ET JOB#: [id number]  cc: Gerrit Heck Elizah Lydon, DPM, <Dictator>

## 2014-05-15 NOTE — Consult Note (Signed)
Note Type Consult   Subjective: Chief Complaint/Diagnosis:   Neutropenia, possibly medication induced. HPI:   Patient is a 64 year old female with recent history of osteomyelitis on long-term antibiotics. She was on approximately 3 weeks and Zosyn when she developed a declining white count and neutropenia. This was subsequently switched to Levaquin. Soon after starting Levaquin, patient developed a diffuse rash. Currently she is weak and fatigued, but otherwise feels well. Her rash is pruritic.  She has no neurologic complaints. She denies any chest pain or shortness of breath. She denies any fevers. She denies any nausea, vomiting, constipation, or diarrhea. She has no urinary complaints. Patient otherwise feels well and offers no further specific complaints.   Review of Systems:  Performance Status (ECOG): 1  Review of Systems:   As per HPI. Otherwise, 10 point system review was negative.   Allergies:  Levaquin: Rash  PFSH: Additional Past Medical and Surgical History: cirrhosis, hysterectomy, small bowel obstruction, right foot surgery, osteomyelitis.    Family history: Hypertension.    Social history: Previous heavy alcohol use, none currently. Denies alcohol.   Home Medications: Medication Instructions Last Modified Date/Time  oxyCODONE 5 mg oral tablet 1 tab(s) orally every 4 hours, As Needed, moderate pain (4-6/10) , As needed, moderate pain (4-6/10) 16-Jan-16 21:44  spironolactone 50 mg oral tablet 1 tab(s) orally 2 times a day 16-Jan-16 21:44  PriLOSEC 20 mg oral delayed release capsule 1 cap(s) orally once a day 16-Jan-16 21:44  furosemide 20 mg oral tablet 1 tab(s) orally once a day 16-Jan-16 21:44  Levaquin 500 mg oral tablet 1 tab(s) orally every 24 hours 16-Jan-16 21:44   Vital Signs:  :: vital signs stable, patient afebrile.   Physical Exam:  General: well developed, well nourished, and in no acute distress  Mental Status: normal affect  Eyes: anicteric sclera   Head, Ears, Nose,Throat: Normocephalic, moist mucous membranes, clear oropharynx without erythema or thrush.  Neck, Thyroid: No palpable lymphadenopathy, thyroid midline without nodules.  Respiratory: clear to auscultation bilaterally  Cardiovascular: regular rate and rhythm, no murmur, rub, or gallop  Gastrointestinal: soft, nondistended, nontender, no organomegaly.  normal active bowel sounds  Musculoskeletal: No edema  Skin: diffuse rash.  Neurological: alert, answering all questions appropriately.  Cranial nerves grossly intact   Laboratory Results: Routine Chem:  17-Jan-16 03:56   Result Comment CBC - SMEAR SCANNED  Result(s) reported on 30 Jan 2014 at 05:16AM.  Potassium, Serum  3.0  Glucose, Serum  104  BUN 10  Creatinine (comp) 1.03  Sodium, Serum 139  Chloride, Serum  108  CO2, Serum  18  Calcium (Total), Serum  7.2  Anion Gap 13  Osmolality (calc) 277  eGFR (African American) >60  eGFR (Non-African American)  58 (eGFR values <29m/min/1.73 m2 may be an indication of chronic kidney disease (CKD). Calculated eGFR, using the MRDR Study equation, is useful in  patients with stable renal function. The eGFR calculation will not be reliable in acutely ill patients when serum creatinine is changing rapidly. It is not useful in patients on dialysis. The eGFR calculation may not be applicable to patients at the low and high extremes of body sizes, pregnant women, and vegetarians.)  Routine Hem:  17-Jan-16 03:56   WBC (CBC)  2.4  RBC (CBC)  2.68  Hemoglobin (CBC)  9.0  Hematocrit (CBC)  26.9  Platelet Count (CBC)  117  MCV 100  MCH 33.4  MCHC 33.4  RDW  15.6  Neutrophil % 8.7  Lymphocyte %  16.7  Monocyte % 33.6  Eosinophil % 39.9  Basophil % 1.1  Neutrophil #  0.2  Lymphocyte #  0.4  Monocyte # 0.8  Eosinophil #  1.0  Basophil # 0.0   Assessment and Plan: Impression:   Neutropenia, possibly medication induced. Plan:   1. Neutropenia: Patient's white count  seems to be mildly improving. It is possible this is related Zosyn. If this were the case, her white blood count should return to normal within the next several days. She does not require Neupogen at this time.  Given her rash, it is possible she developed an underlying viral illness such as CMV, EBV, or parvo B19. Will further discuss with infectious disease prior to drawing these titers. If white blood cell count does not improve, can consider peripheral blood flow cytometry and neutrophil antibodies in the future. Patient does not require bone marrow biopsy at this time, but would consider one in 1-2 weeks if no improvement of her blood counts.Rash: Possibly secondary to Levaquin. Monitor.Osteomyelitis: Appreciate podiatry and ID input.  Electronic Signatures: Delight Hoh (MD)  (Signed 18-Jan-16 19:49)  Authored: Note Type, CC/HPI, Review of Systems, ALLERGIES, Patient Family Social History, HOME MEDICATIONS, Vital Signs, Physical Exam, Lab Results Review, Assessment and Plan   Last Updated: 18-Jan-16 19:49 by Delight Hoh (MD)

## 2014-05-15 NOTE — Discharge Summary (Signed)
PATIENT NAME:  Katie Castro, Katie Castro MR#:  [id number] DATE OF BIRTH:  [birth year]  DATE OF ADMISSION:  01/29/2014 DATE OF DISCHARGE:  02/04/2014  DISCHARGE DIAGNOSES:  1.  Right foot cellulitis.  2.  Sepsis.  3.  Right heel ulcer, status post debridement.  4.  Cirrhosis.  5.  Drug rash.  6.  Chronic pain syndrome.   DISCHARGE MEDICATIONS:  1.  Spironolactone 50 mg oral 2 times a day.  2.  Prilosec 20 mg oral once a day.  3.  Lasix 20 mg oral once a day.  4.  Oxycodone 5 mg oral every 4 hours as needed for moderate pain.  5.  Benadryl 25 mg oral every 8 hours as needed for itching.  6.  Doxycycline 100 mg oral 2 times a day for 1 week.   DISCHARGE INSTRUCTIONS: Low-sodium, regular consistency diet. Activity as tolerated using a walker. Follow up with podiatry in 4 days, on 02/08/2014, for dressing change; and Dr. Ola Spurr with infectious disease in 1-2 weeks. Also home health with physical therapy and nursing has been set up.   CONSULTS: Cheral Marker. Ola Spurr, MD with infectious disease; Sharlotte Alamo, DPM with podiatry; Kathlene November. Grayland Ormond, MD with hematology/oncology.   IMAGING STUDIES: An x-ray of the right foot complete showed possible changes from osteomyelitis.   ADMITTING HISTORY AND PHYSICAL: Please see detailed H and P by Dr. Berline Lopes. In brief, a 64 year old female patient with history of cirrhosis, prior history of osteomyelitis of the right foot, presented to the hospital complaining of fever and worsening redness of the right foot. The patient found to have sepsis along with cellulitis of the right foot and possible osteomyelitis, was admitted to hospitalist service.   HOSPITAL COURSE:  1.  Right foot cellulitis. The patient was seen by both infectious disease and Dr. Cleda Mccreedy of podiatry. After reviewing x-rays and also debridement of the ulcer, Dr. Cleda Mccreedy did not feel like this was osteomyelitis. The patient was on broad-spectrum antibiotics during the hospital stay. She did have  leukopenia secondary to Zosyn, which was stopped. Was seen by hematology. After further workup, it was determined that the leukopenia was secondary to Zosyn and the white count has improved back to normal range after stopping Zosyn. The patient was also on vancomycin, with which her infection improved well, and later is being discharged per Dr. Blane Ohara recommendations of doxycycline for 1 more week and she will follow up with him in the office. Cultures have been negative. The patient's fever has resolved, sepsis has resolved. Dressing has been done by Dr. Cleda Mccreedy and will be changed in his office on 02/08/2014. Also, home health R.N. has been set up.  2.  Cirrhosis, stable.  3.  Drug rash due to Levaquin has resolved.   DISCHARGE PHYSICAL EXAMINATION:  EXTREMITIES: Prior to discharge, the patient's right foot is under dressing.  LUNGS: Sound clear.  ABDOMEN: Nontender. Bowel sounds present.  HEART: Sounds S1, S2. SKIN: No rash on her body.   TIME SPENT ON DAY OF DISCHARGE IN DISCHARGE ACTIVITY: Forty minutes.   ____________________________ Katie Alf Cesareo Vickrey, MD srs:TT D: 02/09/2014 13:23:14 ET T: 02/09/2014 20:38:23 ET JOB#: [id number]  cc: Alveta Heimlich R. Valmore Arabie, MD, <Dictator> Sharlotte Alamo, DPM Cheral Marker. Ola Spurr, MD Guadalupe Maple, MD Neita Carp MD ELECTRONICALLY SIGNED 02/12/2014 1:17

## 2014-05-15 NOTE — H&P (Signed)
PATIENT NAME:  Katie Castro, Katie Castro MR#:  [id number] DATE OF BIRTH:  1951-01-13  DATE OF ADMISSION:  01/29/2014  PRIMARY CARE PHYSICIAN: Guadalupe Maple, MD  INFECTIOUS DISEASE PHYSICIAN: Cheral Marker. Ola Spurr, MD   PODIATRIST: Gerrit Heck. Troxler, DPM   CHIEF COMPLAINT: Fever and worsening redness of the right foot.   HISTORY OF PRESENT ILLNESS: The patient is a 64 year old pleasant female who was just recently admitted to the hospital on 12/11 and discharged with right lower extremity osteomyelitis. The patient was evaluated by Dr. Elvina Mattes and Dr. Ola Spurr during the previous admission and the patient was discharged home with MSSA sepsis from right foot abscess and cellulitis with questionable osteomyelitis. The patient was sent home with Zosyn IV for 15 days and she was evaluated by Dr. Ola Spurr last Wednesday. Her Zosyn was discontinued. The PICC line was discontinued and the patient was started on p.o. levofloxacin. The patient was also given prednisone at the time of discharge. After stopping Zosyn, the patient's redness of the foot got worse associated with swelling. Also, the patient noticed a rash on her body after she started taking levofloxacin, which is diffusely spread all over her body. Denies any throat closing or shortness of breath. The patient did not try any Benadryl. The patient came into the ED. X-ray of the foot has revealed progressing osteomyelitis. Blood cultures and wound cultures were obtained in the ED, and the patient was given Zosyn and vancomycin. Her initial temperature was 100.4 degrees Fahrenheit in the Emergency Department. She is resting comfortably, feeling tired. No other complaints. Husband is at bedside. Denies any dizziness or loss of consciousness.   PAST MEDICAL HISTORY: Alcoholic liver cirrhosis, history of small bowel obstruction after hysterectomy.   PAST SURGICAL HISTORY: Status post hysterectomy, right foot surgery for deformity with 2 screws in place  which were removed during the previous hospital admission, and also the patient is status post incision and drainage, as well as removal of the hardware from the first metatarsocuneiform joint on 12/25/2013 allergies.   ALLERGIES: WE WILL ADD LEVOFLOXACIN TO THE ALLERGY LIST.   HOME MEDICATION LIST: Spironolactone 50 mg p.o. b.i.d., Lasix 10 mg p.o. once daily, Prilosec 10 mg p.o. once daily, oxycodone 5 mg every 4 hours as needed for pain, LEVOFLOXACIN once daily, started 3 days ago. We will discontinue because of the rash.   PSYCHOSOCIAL HISTORY: Lives at home. No history of smoking. Denies any illicit drug use. Stopped drinking alcohol after diagnosed with alcoholic liver cirrhosis.   FAMILY HISTORY: Hypertension runs in her family.   REVIEW OF SYSTEMS:  CONSTITUTIONAL: Complaining of fatigue, weakness. Has a low-grade fever at 100.4 degrees Fahrenheit in the ED.  EYES: Denies blurry vision, double vision, or glaucoma.  ENT: Denies epistaxis or discharge.  RESPIRATORY: Denies cough or COPD.  CARDIOVASCULAR: No chest pain or palpitations.  GASTROINTESTINAL: Denies nausea, vomiting, diarrhea, or abdominal pain. Has liver cirrhosis and jaundice.  GENITOURINARY: No dysuria or hematuria.  GYNECOLOGIC AND BREASTS: Denies breast mass or vaginal discharge.  ENDOCRINE: Denies polyuria, nocturia, or thyroid problems.  HEMATOLOGIC AND LYMPHATIC: No anemia or easy bruising. Denies any bleeding.  INTEGUMENTARY: Has a diffuse maculopapular rash on her skin after starting LEVOFLOXACIN. No acne.  MUSCULOSKELETAL: No joint pain in the neck and back. Denies any shoulder pain.  NEUROLOGICAL: Denies any vertigo, ataxia, or dementia.  PSYCHIATRIC: No ADD or OCD.   PHYSICAL EXAMINATION:  VITAL SIGNS: Temperature 100.4; pulse 105 initially, eventually it was at 95; respirations 19 to  20; blood pressure 114/67; pulse oximetry 97% on room air.  GENERAL APPEARANCE: Not in acute distress. Moderately built and  nourished. HEENT: Normocephalic, atraumatic. Pupils are equally reacting to light and accommodation. Positive scleral icterus. No nasal discharge.  NECK: Supple. No JVD. No thyromegaly. Range of motion is intact.  LUNGS: Clear to auscultation bilaterally. No accessory muscle use and no anterior chest wall tenderness on palpation.  CARDIAC: S1, S2 normal. Regular rate and rhythm. No murmur.  GASTROINTESTINAL: Soft. Bowel sounds are positive in all 4 quadrants. Moderate ascites is present. Nontender. Positive distention. No masses felt.  NEUROLOGIC: Awake, alert, oriented x 3. Cranial nerves II through XII are grossly intact. Motor and sensory are intact. Reflexes are 2+.  EXTREMITIES: Right lower extremity is erythematous, tender, swollen. Minimal discharge is noticed.  SKIN: Warm to touch. Normal turgor. Diffuse maculopapular rash is present, but no discharge.  MUSCULOSKELETAL: No joint effusion. Tenderness and erythema to right foot area.  PSYCHIATRIC: Flat mood and affect.   LABORATORY DATA AND IMAGING STUDIES: Right foot x-ray: Progressive osteomyelitis involving the first metatarsal and medial cuneiform since the examination just over 1 month ago. Chest x-ray, portable: Stable cardiomegaly. No acute cardiopulmonary disease. PT 16.9, INR 1.4. Urinalysis: Amber color, glucose is negative, bilirubin 1+, ketones negative. Nitrite and leukocyte esterase are negative Lactic acid is elevated at 2.7. WBC 3.5, hemoglobin 11.7, hematocrit 34.7, platelets 159,000. Troponin less than 0.2. Serum ammonia level is ordered, which is pending. Total protein 6.8, albumin 2.4, bilirubin is 3.3, alkaline phosphatase 260. AST and ALT are normal. Magnesium level is 1.4. BUN and creatinine are normal. Sodium 133, potassium 3.2. Chloride, CO2, anion gap, and GFR are normal. Serum osmolality is normal. Calcium 8.4, phosphorus 2.7, glucose is 104.   ASSESSMENT AND PLAN: A 64 year old female recently admitted to the hospital  in December with right foot cellulitis and abscess, status post incision and drainage, and there was  concern about osteomyelitis; discharged home with Zosyn 3.375 mg every 8 hours for 2 weeks; seen by Dr. Ola Spurr last Wednesday who discontinued her Zosyn and started her on oral LEVOFLOXACIN. After she started taking oral LEVOFLOXACIN, the patient started developing rash, but denies any shortness of breath. Her peripherally inserted central catheter line was removed last Wednesday. Currently, the patient is coming to the Emergency Department with worsening of the swelling, redness, and pain in the right lower extremity.  1.  Sepsis with fever, tachycardia, and elevated lactic acid level secondary to probable right foot osteomyelitis. Blood cultures and wound cultures were obtained in the Emergency Department. Urine culture was also ordered. The patient is started on intravenous Zosyn and vancomycin. We will consult Dr. Ola Spurr and also podiatry, Dr. Elvina Mattes. 2.  Pain management will be provided by oxycodone and morphine as needed basis.  3.  History of alcoholic liver cirrhosis. The patient stopped drinking. Currently, her platelet count is in the normal range. Ammonia level is ordered, which is pending at this time, that needs to be followed.  4.  Jaundice, probably from alcoholic liver cirrhosis. Currently, the patient is not lethargic and not with any altered mental status. We will continue close monitoring of the patient.  5.  We will provide gastrointestinal and deep vein thrombosis prophylaxis.   Plan of care discussed with the patient.   CODE STATUS: She is FULL CODE. Her ex-husband is the medical power of attorney.   TOTAL TIME SPENT: 50 minutes.    ____________________________ Nicholes Mango, MD ag:ts D: 01/29/2014 20:38:30 ET  T: 01/29/2014 21:57:47 ET JOB#: [id number]  cc: Nicholes Mango, MD, <Dictator> Guadalupe Maple, MD Cheral Marker. Ola Spurr, MD Gerrit Heck Troxler, DPM Nicholes Mango  MD ELECTRONICALLY SIGNED 01/30/2014 14:57

## 2014-05-15 NOTE — Consult Note (Signed)
Brief Consult Note: Diagnosis: redness swelling, and rash to right foot as well as systemic rash.   Patient was seen by consultant.   Consult note dictated.   Recommend further assessment or treatment.   Discussed with Attending MD.   Comments: Xrays reviewed and once again I do noty feel the non union at the 1st met cuneiform represents osteo.  On the contrary, It appears the bone is tryin to heal across the nonunion site instead of breaking down as in osteo.  RecommendInfectious Disease consult.  I will continue to follow.  The patient has a decubitus ulcer on her right heel that needs debridement and I hope to do that while she is in hospital.  Rash is of uncertain origin, but may be due to levaquin allergy.  Electronic Signatures: Perry Mount (MD)  (Signed 17-Jan-16 10:39)  Authored: Brief Consult Note   Last Updated: 17-Jan-16 10:39 by Perry Mount (MD)

## 2014-05-15 NOTE — Consult Note (Signed)
PATIENT NAME:  Katie Castro, Katie Castro MR#:  [id number] DATE OF BIRTH:  Mar 26, 1950  DATE OF CONSULTATION:  01/30/2014  REFERRING PHYSICIAN:   CONSULTING PHYSICIAN:  Gerrit Heck. Trissa Molina, DPM  REASON FOR CONSULTATION: Redness and swelling to the right foot. She was admitted through the Emergency Room yesterday.   HISTORY OF PRESENT ILLNESS: The patient has had problems with the right foot over the last several months. I first was consulted and took care of her a month ago when she was hospitalized for an infection in the right foot. At that time, I removed hardware that had loosened and backed out of an area on the first metatarsocuneiform joint where she had had surgery done in [place] back in May. 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 At the time of her surgery last month to remove the hardware, it appeared that the infection was fairly minimal across the region other than just the soft tissue infection. It did not appear to penetrate down to the bone. She has been on Zosyn intravenously for the last 2-3 weeks and was taken off that on Wednesday by Dr. Ola Spurr and placed on oral Levaquin, because she was showing signs of neutropenia with her white count down to 2.0. She states she is feeling fine, but Saturday she started developing a fever and was disoriented and dizzy and was brought into the Emergency Room.   PAST MEDICAL HISTORY: Includes cirrhosis of the liver secondary to alcohol. A history of small bowel obstruction after hysterectomy.   PAST SURGICAL HISTORY: Includes status post hysterectomy. She had her right foot surgery as noted above in May with a failed fusion. Another surgery done in December to remove hardware from the infected area.   MEDICATIONS: Her home medication list includes spironolactone 50 mg p.o. b.i.d., Lasix 10 mg daily, Prilosec 10 mg daily, oxycodone 5 mg daily and levofloxacin once a day,  but she has developed a significant rash on her chest and lower extremity and it is itching some for her at this timeframe also.   PHYSICAL EXAMINATION: GENERAL: She is alert and well oriented at this juncture. Does not appear to be in pain at all at this time frame, just itching from the rash.  VITAL SIGNS: Temperature at this point is 97.6, earlier this morning it was 102, yesterday it was 100, but twice today it has been in normal range. Her pulse is 80, respirations 20, blood pressure is 94/58 at this juncture, earlier this morning it was at 120/55.  LOWER EXTREMITIES: Today shows noted swelling to the right foot. There is no purulence or drainage coming from the previous dorsal incision across the previously infected area. She does have a wound on the heel, which we started to address this past week; it is a likely full-thickness wound down to the fatty tissue, but did not appear to have any real infectious process prior to seeing (Dictation Anomaly) <<the patient in my officeMISSING TEXT>> prior to yesterday. However, she does have severe neutropenia, which could influence her ability to fight infection.   RADIOGRAPHIC DATA: X-rays taken here reviewed by the radiologist. 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 She actually has more bone proliferation around the area instead of bone destruction. This is more consistent to  me with attempts at bone healing and fusion versus osteomyelitis.   CLINICAL IMPRESSION: Possible soft tissue infection possibly coming from the heel, does not appear to be coming from the dorsum of the foot. 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 Complicating issues are her neutropenia and cirrhosis.   TREATMENT  PLAN AND SUGGESTIONS: Keep her nonweightbearing at this point other than just some bathroom privileges. She has been switched off the Levaquin and placed on Zosyn in the event that she may have an allergic reaction to the Levaquin. She is also getting vancomycin with this. I want Dr. Ola Spurr to see her and get an opinion on what his thoughts in the reaction process are and try to really keep a watchful eye on this. If possible, I would like to debride the heel ulcer while she is in the hospital and maybe put some type of an Apligraf on that region. It is a matter of just trying to see if it is a good time to try to do that at this point based on the other issues that she has going on, especially the neutropenia. I will continue to follow her tomorrow, let her continue to get the Zosyn and vancomycin today. She has got a pad and a dressing on her heel at this timeframe. Also, keep her foot suspended with no pressure on the heel at all. I will see her again tomorrow.    ____________________________ Gerrit Heck Deshonna Trnka, DPM mgt:TT D: 01/30/2014 10:34:00 ET T: 01/30/2014 16:25:00 ET JOB#: [id number]  cc: Gerrit Heck Asim Gersten, DPM, <Dictator> Perry Mount MD ELECTRONICALLY SIGNED 02/21/2014 12:43

## 2014-05-23 ENCOUNTER — Ambulatory Visit
Admission: RE | Admit: 2014-05-23 | Discharge: 2014-05-23 | Disposition: A | Discharge status: Home / Self Care | Payer: BLUE CROSS/BLUE SHIELD | Source: Ambulatory Visit | Attending: Urgent Care | Admitting: Urgent Care

## 2014-06-20 ENCOUNTER — Encounter
Admission: RE | Admit: 2014-06-20 | Discharge: 2014-06-20 | Disposition: A | Discharge status: Home / Self Care | Payer: BLUE CROSS/BLUE SHIELD | Source: Ambulatory Visit | Attending: Podiatry | Admitting: Podiatry

## 2014-06-20 DIAGNOSIS — X58XXXD Exposure to other specified factors, subsequent encounter: Secondary | ICD-10-CM | POA: Diagnosis not present

## 2014-06-20 DIAGNOSIS — T85698D Other mechanical complication of other specified internal prosthetic devices, implants and grafts, subsequent encounter: Secondary | ICD-10-CM | POA: Insufficient documentation

## 2014-06-20 DIAGNOSIS — Z01812 Encounter for preprocedural laboratory examination: Secondary | ICD-10-CM | POA: Insufficient documentation

## 2014-06-20 DIAGNOSIS — Z01810 Encounter for preprocedural cardiovascular examination: Secondary | ICD-10-CM | POA: Insufficient documentation

## 2014-06-20 LAB — CBC
HEMATOCRIT: 38.7 % (ref 35.0–47.0)
Hemoglobin: 12.8 g/dL (ref 12.0–16.0)
MCH: 30.8 pg (ref 26.0–34.0)
MCHC: 33 g/dL (ref 32.0–36.0)
MCV: 93.4 fL (ref 80.0–100.0)
Platelets: 137 10*3/uL — ABNORMAL LOW (ref 150–440)
RBC: 4.15 MIL/uL (ref 3.80–5.20)
RDW: 17.6 % — AB (ref 11.5–14.5)
WBC: 5.2 10*3/uL (ref 3.6–11.0)

## 2014-06-20 LAB — BASIC METABOLIC PANEL WITH GFR
Anion gap: 9 (ref 5–15)
BUN: 13 mg/dL (ref 6–20)
CO2: 24 mmol/L (ref 22–32)
Calcium: 9.8 mg/dL (ref 8.9–10.3)
Chloride: 102 mmol/L (ref 101–111)
Creatinine, Ser: 0.61 mg/dL (ref 0.44–1.00)
GFR calc Af Amer: >60 mL/min
GFR calc non Af Amer: >60 mL/min
Glucose, Bld: 100 mg/dL — ABNORMAL HIGH (ref 65–99)
Potassium: 3.8 mmol/L (ref 3.5–5.1)
Sodium: 135 mmol/L (ref 135–145)

## 2014-06-20 NOTE — Patient Instructions (Addendum)
  Your procedure is scheduled on: 06/24/14 Report to Day Surgery. To find out your arrival time please call [phone] between 1PM - 3PM on 06/23/14.  Remember: Instructions that are not followed completely may result in serious medical risk, up to and including death, or upon the discretion of your surgeon and anesthesiologist your surgery may need to be rescheduled.    ____ 1. Do not eat food or drink liquids after midnight. No gum chewing or hard candies.     ____ 2. No Alcohol for 24 hours before or after surgery.   ____ 3. Bring all medications with you on the day of surgery if instructed.    ____ 4. Notify your doctor if there is any change in your medical condition     (cold, fever, infections).     Do not wear jewelry, make-up, hairpins, clips or nail polish.  Do not wear lotions, powders, or perfumes. You may wear deodorant.  Do not shave 48 hours prior to surgery. Men may shave face and neck.  Do not bring valuables to the hospital.    [facility] is not responsible for any belongings or valuables.               Contacts, dentures or bridgework may not be worn into surgery.  Leave your suitcase in the car. After surgery it may be brought to your room.  For patients admitted to the hospital, discharge time is determined by your                treatment team.   Patients discharged the day of surgery will not be allowed to drive home.   Please read over the following fact sheets that you were given:   Surgical Site Infection Prevention   ____ Take these medicines the morning of surgery with A SIP OF WATER:    1.   2.   3.   4.  5.  6.  ____ Fleet Enema (as directed)   __x__ Use CHG Soap as directed  ____ Use inhalers on the day of surgery  ____ Stop metformin 2 days prior to surgery    ____ Take 1/2 of usual insulin dose the night before surgery and none on the morning of surgery.   ____ Stop Coumadin/Plavix/aspirin on  ____ Stop Anti-inflammatories on     ____ Stop supplements until after surgery.    ____ Bring C-Pap to the hospital.

## 2014-06-20 NOTE — OR Nursing (Signed)
Unknown drug caused her to turn very red

## 2014-06-24 ENCOUNTER — Ambulatory Visit: Payer: BLUE CROSS/BLUE SHIELD | Admitting: *Deleted

## 2014-06-24 ENCOUNTER — Encounter: Payer: Self-pay | Admitting: *Deleted

## 2014-06-24 ENCOUNTER — Encounter
Admission: RE | Disposition: A | Discharge status: Home / Self Care | Payer: Self-pay | Source: Ambulatory Visit | Attending: Podiatry

## 2014-06-24 ENCOUNTER — Telehealth: Payer: Self-pay | Admitting: Urgent Care

## 2014-06-24 ENCOUNTER — Ambulatory Visit
Admission: RE | Admit: 2014-06-24 | Discharge: 2014-06-24 | Disposition: A | Discharge status: Home / Self Care | Payer: BLUE CROSS/BLUE SHIELD | Source: Ambulatory Visit | Attending: Podiatry | Admitting: Podiatry

## 2014-06-24 DIAGNOSIS — T8484XA Pain due to internal orthopedic prosthetic devices, implants and grafts, initial encounter: Secondary | ICD-10-CM | POA: Diagnosis not present

## 2014-06-24 DIAGNOSIS — Z8614 Personal history of Methicillin resistant Staphylococcus aureus infection: Secondary | ICD-10-CM | POA: Insufficient documentation

## 2014-06-24 DIAGNOSIS — Y838 Other surgical procedures as the cause of abnormal reaction of the patient, or of later complication, without mention of misadventure at the time of the procedure: Secondary | ICD-10-CM | POA: Diagnosis not present

## 2014-06-24 DIAGNOSIS — G8929 Other chronic pain: Secondary | ICD-10-CM | POA: Insufficient documentation

## 2014-06-24 DIAGNOSIS — Z9889 Other specified postprocedural states: Secondary | ICD-10-CM

## 2014-06-24 DIAGNOSIS — Y9289 Other specified places as the place of occurrence of the external cause: Secondary | ICD-10-CM | POA: Diagnosis not present

## 2014-06-24 HISTORY — PX: HARDWARE REMOVAL: SHX979

## 2014-06-24 SURGERY — REMOVAL, HARDWARE
Anesthesia: Monitor Anesthesia Care | Laterality: Right

## 2014-06-24 MED ORDER — PROPOFOL 10 MG/ML IV BOLUS
INTRAVENOUS | Status: DC | PRN
  Administered 2014-06-24: 50 mg via INTRAVENOUS

## 2014-06-24 MED ORDER — PROPOFOL 10 MG/ML IV BOLUS: INTRAVENOUS | Status: DC | PRN

## 2014-06-24 MED ORDER — HYDROCODONE-ACETAMINOPHEN 7.5-325 MG PO TABS: 1.0000 | ORAL_TABLET | Freq: Four times a day (QID) | ORAL | Status: DC | PRN

## 2014-06-24 MED ORDER — LIDOCAINE HCL (PF) 1 % IJ SOLN
INTRAMUSCULAR | Status: AC
  Filled 2014-06-24: qty 2

## 2014-06-24 MED ORDER — BUPIVACAINE HCL (PF) 0.5 % IJ SOLN
INTRAMUSCULAR | Status: AC
  Filled 2014-06-24: qty 30

## 2014-06-24 MED ORDER — HYDROMORPHONE HCL 1 MG/ML IJ SOLN: 0.2500 mg | INTRAMUSCULAR | Status: DC | PRN

## 2014-06-24 MED ORDER — ONDANSETRON HCL 4 MG/2ML IJ SOLN: 4.0000 mg | Freq: Once | INTRAMUSCULAR | Status: DC | PRN

## 2014-06-24 MED ORDER — BUPIVACAINE HCL 0.5 % IJ SOLN
INTRAMUSCULAR | Status: DC | PRN
  Administered 2014-06-24: 10 mL

## 2014-06-24 MED ORDER — MIDAZOLAM HCL 2 MG/2ML IJ SOLN
INTRAMUSCULAR | Status: DC | PRN
Start: 2014-06-24 — End: 2014-06-24
  Administered 2014-06-24: 2 mg via INTRAVENOUS
  Administered 2014-06-24 (×2): 1 mg via INTRAVENOUS

## 2014-06-24 MED ORDER — FENTANYL CITRATE (PF) 100 MCG/2ML IJ SOLN
INTRAMUSCULAR | Status: DC | PRN
  Administered 2014-06-24 (×2): 50 ug via INTRAVENOUS

## 2014-06-24 MED ORDER — CEFAZOLIN SODIUM-DEXTROSE 2-3 GM-% IV SOLR
2.0000 g | INTRAVENOUS | Status: AC
  Administered 2014-06-24: 2 g via INTRAVENOUS

## 2014-06-24 MED ORDER — LIDOCAINE HCL (PF) 1 % IJ SOLN
INTRAMUSCULAR | Status: AC
  Filled 2014-06-24: qty 30

## 2014-06-24 MED ORDER — LACTATED RINGERS IV SOLN
INTRAVENOUS | Status: DC
  Administered 2014-06-24: 1000 mL via INTRAVENOUS

## 2014-06-24 MED ORDER — LIDOCAINE HCL (CARDIAC) 20 MG/ML IV SOLN
INTRAVENOUS | Status: DC | PRN
  Administered 2014-06-24: 60 mg via INTRAVENOUS

## 2014-06-24 MED ORDER — CEFAZOLIN SODIUM-DEXTROSE 2-3 GM-% IV SOLR
INTRAVENOUS | Status: AC
  Filled 2014-06-24: qty 50

## 2014-06-24 SURGICAL SUPPLY — 37 items
APL SKNCLS STERI-STRIP NONHPOA (GAUZE/BANDAGES/DRESSINGS) ×1
BANDAGE ELASTIC 4 CLIP NS LF (GAUZE/BANDAGES/DRESSINGS) ×3 IMPLANT
BANDAGE STRETCH 3X4.1 STRL (GAUZE/BANDAGES/DRESSINGS) ×3 IMPLANT
BENZOIN TINCTURE PRP APPL 2/3 (GAUZE/BANDAGES/DRESSINGS) ×2 IMPLANT
BNDG ADH 5X4 AIR PERM ELC (GAUZE/BANDAGES/DRESSINGS) ×1
BNDG CMPR 82X61 PLY HI ABS (GAUZE/BANDAGES/DRESSINGS) ×1
BNDG COHESIVE 4X5 WHT NS (GAUZE/BANDAGES/DRESSINGS) ×2 IMPLANT
BNDG CONFORM 6X.82 1P STRL (GAUZE/BANDAGES/DRESSINGS) ×2 IMPLANT
BNDG ESMARK 4X12 TAN STRL LF (GAUZE/BANDAGES/DRESSINGS) ×3 IMPLANT
BNDG GAUZE 4.5X4.1 6PLY STRL (MISCELLANEOUS) ×2 IMPLANT
CAST PADDING 3X4FT ST 30246 (SOFTGOODS)
CLOSURE WOUND 1/4X4 (GAUZE/BANDAGES/DRESSINGS) ×1
CUFF TOURN 18 STER (MISCELLANEOUS) ×2 IMPLANT
CUFF TOURN DUAL PL 12 NO SLV (MISCELLANEOUS) ×2 IMPLANT
DRAPE FLUOR MINI C-ARM 54X84 (DRAPES) ×3 IMPLANT
GAUZE PETRO XEROFOAM 1X8 (MISCELLANEOUS) ×3 IMPLANT
GAUZE SPONGE 4X4 12PLY STRL (GAUZE/BANDAGES/DRESSINGS) ×3 IMPLANT
GLOVE INDICATOR 7.5 STRL GRN (GLOVE) ×7 IMPLANT
GOWN STRL REUS W/ TWL LRG LVL3 (GOWN DISPOSABLE) ×2 IMPLANT
GOWN STRL REUS W/TWL LRG LVL3 (GOWN DISPOSABLE) ×6
KIT RM TURNOVER STRD PROC AR (KITS) ×3 IMPLANT
NDL FILTER BLUNT 18X1 1/2 (NEEDLE) ×1 IMPLANT
NDL SAFETY 25GX1.5 (NEEDLE) ×1 IMPLANT
NEEDLE FILTER BLUNT 18X 1/2SAF (NEEDLE) ×2
NEEDLE FILTER BLUNT 18X1 1/2 (NEEDLE) ×1 IMPLANT
NS IRRIG 500ML POUR BTL (IV SOLUTION) ×3 IMPLANT
PACK EXTREMITY ARMC (MISCELLANEOUS) ×3 IMPLANT
PAD CAST CTTN 3X4 STRL (SOFTGOODS) ×1 IMPLANT
PADDING CAST COTTON 3X4 STRL (SOFTGOODS)
STERILE TOURNIQUET ×2 IMPLANT
STOCKINETTE STRL 6IN 960660 (GAUZE/BANDAGES/DRESSINGS) ×3 IMPLANT
STRIP CLOSURE SKIN 1/4X4 (GAUZE/BANDAGES/DRESSINGS) ×2 IMPLANT
SUT VIC AB 4-0 FS2 27 (SUTURE) ×3 IMPLANT
SWABSTK COMLB BENZOIN TINCTURE (MISCELLANEOUS) ×3 IMPLANT
SYR 20CC LL (SYRINGE) ×3 IMPLANT
SYR 3ML LL SCALE MARK (SYRINGE) ×1 IMPLANT
SYRINGE 10CC LL (SYRINGE) ×3 IMPLANT

## 2014-06-24 NOTE — Progress Notes (Signed)
FOOT DRESSING  BENZOIN 4X4S, STERIS-TRIPS CONFORM, KERLIX, ACE BANDAGE, PAPER TAPE

## 2014-06-24 NOTE — Transfer of Care (Signed)
Immediate Anesthesia Transfer of Care Note  Patient: Katie Castro  Procedure(s) Performed: Procedure(s) with comments: HARDWARE REMOVAL (Right) - 1 SCREW EXPLANTED GIVEN TO PT  Patient Location: PACU  Anesthesia Type:General  Level of Consciousness: awake, alert  and oriented  Airway & Oxygen Therapy: Patient Spontanous Breathing  Post-op Assessment: Report given to RN  Post vital signs: Reviewed  Last Vitals:  Filed Vitals:   06/24/14 1530  BP: 134/73  Pulse: 78  Temp: 36.3 C  Resp: 20    Complications: No apparent anesthesia complications

## 2014-06-24 NOTE — Anesthesia Preprocedure Evaluation (Signed)
Anesthesia Evaluation  Patient identified by MRN, date of birth, ID band Patient awake    Reviewed: Allergy & Precautions, NPO status , Patient's Chart, lab work & pertinent test results  Airway Mallampati: II  TM Distance: >3 FB Neck ROM: Full    Dental  (+) Teeth Intact   Pulmonary    Pulmonary exam normal       Cardiovascular Normal cardiovascular exam    Neuro/Psych    GI/Hepatic   Endo/Other    Renal/GU      Musculoskeletal   Abdominal (+)  Abdomen: soft.    Peds  Hematology   Anesthesia Other Findings   Reproductive/Obstetrics                             Anesthesia Physical Anesthesia Plan  ASA: II  Anesthesia Plan: MAC   Post-op Pain Management:    Induction:   Airway Management Planned:   Additional Equipment:   Intra-op Plan:   Post-operative Plan:   Informed Consent: I have reviewed the patients History and Physical, chart, labs and discussed the procedure including the risks, benefits and alternatives for the proposed anesthesia with the patient or authorized representative who has indicated his/her understanding and acceptance.     Plan Discussed with: CRNA  Anesthesia Plan Comments:         Anesthesia Quick Evaluation

## 2014-06-24 NOTE — Telephone Encounter (Signed)
LVM for pt to call and resch for f/u appt

## 2014-06-24 NOTE — Anesthesia Postprocedure Evaluation (Signed)
  Anesthesia Post-op Note  Patient: Katie Castro  Procedure(s) Performed: Procedure(s) with comments: HARDWARE REMOVAL (Right) - 1 SCREW EXPLANTED GIVEN TO PT  Anesthesia type:MAC, General  Patient location: PACU  Post pain: Pain level controlled  Post assessment: Post-op Vital signs reviewed, Patient's Cardiovascular Status Stable, Respiratory Function Stable, Patent Airway and No signs of Nausea or vomiting  Post vital signs: Reviewed and stable  Last Vitals:  Filed Vitals:   06/24/14 1530  BP: 134/73  Pulse: 78  Temp: 36.3 C  Resp: 20    Level of consciousness: awake, alert  and patient cooperative  Complications: No apparent anesthesia complications

## 2014-06-24 NOTE — Discharge Instructions (Signed)
Dispense po instructions.  Full weight bearing allowed.

## 2014-06-24 NOTE — H&P (Signed)
H&P reviewed and updated

## 2014-06-24 NOTE — Op Note (Signed)
Operative note   Surgeon: Dr. Albertine Patricia, DPM.    Assistant: None    Preop diagnosis: Painful bone screw right proximal phalanx    Postop diagnosis: Same    Procedure:   1. Removal of painful screw from right proximal phalanx     EBL: Negligible less than 5 cc    Anesthesia:IV sedation    Hemostasis: Ankle tourniquet 250 mmHg pressure    Specimen: Screw removal which was photographed and given to the patient.    Complications: None    Operative indications: Chronic pain from orthopedic hardware and prominence of the screw head potentially causing irritation to the joint    Procedure:  Patient was brought into the OR and placed on the operating table in thesupine position. After anesthesia was obtained theright lower extremity was prepped and draped in usual sterile fashion.  Operative Report: At this time attention was directed to the right medial first metatarsal phalangeal joint in particular the base of the proximal phalanx. A 2 cm linear incision was made through an old scar line on the medial aspect of this region deepened sharp and blunt dissection. Tissue was encountered and incised longitudinally. Dissection the screw head was noted. A 2.2 screwdriver from the[employer] screw set was utilized to remove the screw. Once this was removed the area was copiously irrigated and capsular tissues closed with 4 Vicryl in a continuous stitch as were deep and superficial fascial layers. Skin was closed utilizing 4-0 Vicryl subcuticular stitch. A sterile compressive dressing was placed across the wound consisting of Steri-Strips for gauze 4 x 4's and Kling. The tourniquet was released and prompt complete vascularity was seen to return to all digits.    Patient tolerated the procedure and anesthesia well.  Was transported from the OR to the PACU with all vital signs stable and vascular status intact. To be discharged per routine protocol.  Will follow up in approximately 1 week in the  outpatient clinic.

## 2014-06-27 ENCOUNTER — Telehealth: Payer: Self-pay | Admitting: Urgent Care

## 2014-06-27 ENCOUNTER — Encounter: Payer: Self-pay | Admitting: Podiatry

## 2014-06-27 NOTE — Telephone Encounter (Signed)
LVM for pt to resch appt on 7/29

## 2014-08-09 ENCOUNTER — Encounter: Payer: Self-pay | Admitting: Gastroenterology

## 2014-08-12 ENCOUNTER — Ambulatory Visit: Payer: Self-pay | Admitting: Urgent Care

## 2014-08-19 ENCOUNTER — Ambulatory Visit: Payer: BC Managed Care – PPO | Admitting: Gynecology

## 2014-10-04 NOTE — Telephone Encounter (Signed)
error 

## 2014-10-18 ENCOUNTER — Telehealth: Payer: Self-pay

## 2014-10-18 NOTE — Telephone Encounter (Signed)
Patient called in and states that she needs an appointment because she is "jaundiced." This was passed back to me to triage. I spoke with the patient. She has restarted drinking and was drinking 1-2 bottles of wine daily. She states that she stopped for good this past week. Patient also states that she has decreased her Spironolactone and Ursodiol because she was running out of medication and she was not having any symptoms. Patient placed on schedule 10/10 per Dr. Allen Norris and we spoke about abstaining from alcohol and tylenol until she is seen by Dr. Allen Norris to keep jaundice from getting worse before he sees her.   Explained that if she begins to have lethargy, hallucinations, or difficulty breathing like she was in months past prior to her being seen, she needs to go to the Emergency Room immediately.   Pt verbalizes understanding of this information and will be at appointment on 10/24/14.   Directions given to [facility] location.

## 2014-10-21 DIAGNOSIS — L97409 Non-pressure chronic ulcer of unspecified heel and midfoot with unspecified severity: Secondary | ICD-10-CM | POA: Insufficient documentation

## 2014-10-24 ENCOUNTER — Encounter: Payer: Self-pay | Admitting: Gastroenterology

## 2014-10-24 ENCOUNTER — Ambulatory Visit (INDEPENDENT_AMBULATORY_CARE_PROVIDER_SITE_OTHER): Payer: BLUE CROSS/BLUE SHIELD | Admitting: Gastroenterology

## 2014-10-24 VITALS — BP 119/69 | HR 78 | Temp 98.4°F | Ht 69.0 in | Wt 184.0 lb

## 2014-10-24 DIAGNOSIS — K7030 Alcoholic cirrhosis of liver without ascites: Secondary | ICD-10-CM | POA: Diagnosis not present

## 2014-10-24 MED ORDER — SPIRONOLACTONE 50 MG PO TABS: 50.0000 mg | ORAL_TABLET | Freq: Two times a day (BID) | ORAL | Status: DC

## 2014-10-24 MED ORDER — FUROSEMIDE 20 MG PO TABS: 20.0000 mg | ORAL_TABLET | Freq: Every day | ORAL | Status: DC

## 2014-10-24 NOTE — Progress Notes (Signed)
Primary Care Physician: Tracie Harrier, MD  Primary Gastroenterologist:  Dr. Lucilla Lame  Chief Complaint  Patient presents with  . follow up cirrhosis    HPI: Katie Castro is a 64 y.o. female here follow-up of alcoholic liver disease. The patient states that she had a lot stress in her life and started drinking again. The patient has now quit 9 days ago. She notes that she was getting more and more jaundice. The patient states that she was under and oriented on the fact department which made her start drinking again. There is no report of any nausea vomiting. The patient does report that she had ran out of most of her medications and needs a refill of all of her medications.  Current Outpatient Prescriptions  Medication Sig Dispense Refill  . b complex vitamins capsule Take 1 capsule by mouth daily.    Marland Kitchen spironolactone (ALDACTONE) 50 MG tablet Take 1 tablet (50 mg total) by mouth 2 (two) times daily. 60 tablet 5  . ursodiol (ACTIGALL) 300 MG capsule Take 300 mg by mouth every morning.    . furosemide (LASIX) 20 MG tablet Take 1 tablet (20 mg total) by mouth daily. 30 tablet 5  . HYDROcodone-acetaminophen (NORCO) 7.5-325 MG per tablet Take 1 tablet by mouth every 6 (six) hours as needed for moderate pain. (Patient not taking: Reported on 10/18/2014) 30 tablet 0  . hydrocortisone (ANUSOL-HC) 25 MG suppository Place 1 suppository (25 mg total) rectally 2 (two) times daily. (Patient not taking: Reported on 10/18/2014) 12 suppository 0   No current facility-administered medications for this visit.    Allergies as of 10/24/2014  . (No Known Allergies)    ROS:  General: Negative for anorexia, weight loss, fever, chills, fatigue, weakness. ENT: Negative for hoarseness, difficulty swallowing , nasal congestion. CV: Negative for chest pain, angina, palpitations, dyspnea on exertion, peripheral edema.  Respiratory: Negative for dyspnea at rest, dyspnea on exertion, cough, sputum,  wheezing.  GI: See history of present illness. GU:  Negative for dysuria, hematuria, urinary incontinence, urinary frequency, nocturnal urination.  Endo: Negative for unusual weight change.    Physical Examination:   BP 119/69 mmHg  Pulse 78  Temp(Src) 98.4 F (36.9 C) (Oral)  Ht 5\' 9"  (1.753 m)  Wt 184 lb (83.462 kg)  BMI 27.16 kg/m2  LMP 01/14/2006  General: Well-nourished, well-developed in no acute distress.  Eyes: Positive icterus. Mouth: Oropharyngeal mucosa moist and pink , no lesions erythema or exudate. Lungs: Clear to auscultation bilaterally. Non-labored. Heart: Regular rate and rhythm, no murmurs rubs or gallops.  Abdomen: Bowel sounds are normal, nontender, no abdominal bruits or hernia , no rebound or guarding.  There is significant hepatomegaly. Extremities: No lower extremity edema. No clubbing or deformities. Neuro: Alert and oriented x 3.  Grossly intact. Skin: Warm and dry, positive jaundice.   Psych: Alert and cooperative, normal mood and affect.  Labs:    Imaging Studies: No results found.  Assessment and Plan:   Katie Castro is a 64 y.o. y/o female who comes in with a history of alcoholic liver disease who stop drinking and had her liver enzymes back in November come down from 30 to 3 by January after she stopped drinking. The patient went back to drinking until October 1 when she states she has stopped drinking again. The patient has been told to enroll in a alcohol treatment program and consider evaluation for a liver transplant. The patient does not seem interested in this and  states she has stopped drinking already. Patient will have her labs sent off today including AFP, CBC and CMP. The patient will also have a right upper quadrant ultrasound sent off. Her follow-up in one month.   Note: This dictation was prepared with Dragon dictation along with smaller phrase technology. Any transcriptional errors that result from this process are  unintentional.

## 2014-10-25 LAB — CBC WITH DIFFERENTIAL/PLATELET
BASOS: 0 %
Basophils Absolute: 0 10*3/uL (ref 0.0–0.2)
EOS (ABSOLUTE): 0 10*3/uL (ref 0.0–0.4)
EOS: 1 %
HEMATOCRIT: 32.2 % — AB (ref 34.0–46.6)
HEMOGLOBIN: 11.3 g/dL (ref 11.1–15.9)
Immature Grans (Abs): 0 10*3/uL (ref 0.0–0.1)
Immature Granulocytes: 1 %
LYMPHS ABS: 0.4 10*3/uL — AB (ref 0.7–3.1)
Lymphs: 7 %
MCH: 34.2 pg — ABNORMAL HIGH (ref 26.6–33.0)
MCHC: 35.1 g/dL (ref 31.5–35.7)
MCV: 98 fL — AB (ref 79–97)
MONOCYTES: 14 %
MONOS ABS: 0.7 10*3/uL (ref 0.1–0.9)
NEUTROS ABS: 4.1 10*3/uL (ref 1.4–7.0)
Neutrophils: 77 %
Platelets: 160 10*3/uL (ref 150–379)
RBC: 3.3 x10E6/uL — AB (ref 3.77–5.28)
RDW: 18.5 % — ABNORMAL HIGH (ref 12.3–15.4)
WBC: 5.2 10*3/uL (ref 3.4–10.8)

## 2014-10-25 LAB — AFP TUMOR MARKER: AFP-Tumor Marker: 2.9 ng/mL (ref 0.0–8.3)

## 2014-11-15 ENCOUNTER — Ambulatory Visit: Payer: BLUE CROSS/BLUE SHIELD

## 2014-12-13 ENCOUNTER — Ambulatory Visit: Payer: BLUE CROSS/BLUE SHIELD

## 2014-12-14 ENCOUNTER — Telehealth: Payer: Self-pay

## 2014-12-14 NOTE — Telephone Encounter (Signed)
Contacted central scheduling regarding pt's RUQ [place]. Per peggy, pt cancelled on 11/15/14 but rescheduled for 12/13/14. She was a no show for the appt on 12/13/14. Called pt but no answer. Left a vm asking her to return my call to reschedule.

## 2014-12-23 ENCOUNTER — Other Ambulatory Visit: Payer: Self-pay

## 2014-12-23 DIAGNOSIS — K7030 Alcoholic cirrhosis of liver without ascites: Secondary | ICD-10-CM

## 2015-01-03 ENCOUNTER — Telehealth: Payer: Self-pay | Admitting: Gastroenterology

## 2015-01-03 NOTE — Telephone Encounter (Signed)
Patient called back she called central scheduling they scheduled her for 01/26/2015 at 9:00 no need to call patient back

## 2015-01-03 NOTE — Telephone Encounter (Signed)
Patient called and asked about an U/S she is suppose to have before her appt on 1/17

## 2015-01-26 ENCOUNTER — Ambulatory Visit: Payer: BLUE CROSS/BLUE SHIELD

## 2015-01-31 ENCOUNTER — Ambulatory Visit: Payer: Self-pay | Admitting: Gastroenterology

## 2015-02-22 ENCOUNTER — Ambulatory Visit: Payer: BLUE CROSS/BLUE SHIELD

## 2015-03-02 ENCOUNTER — Ambulatory Visit: Payer: Self-pay | Admitting: Gastroenterology

## 2015-05-02 ENCOUNTER — Emergency Department: Payer: BLUE CROSS/BLUE SHIELD

## 2015-05-02 ENCOUNTER — Inpatient Hospital Stay
Admission: EM | Admit: 2015-05-02 | Discharge: 2015-05-03 | DRG: 442 | Disposition: A | Discharge status: Hospice | Payer: BLUE CROSS/BLUE SHIELD | Attending: Internal Medicine | Admitting: Internal Medicine

## 2015-05-02 ENCOUNTER — Encounter: Payer: Self-pay | Admitting: Emergency Medicine

## 2015-05-02 DIAGNOSIS — D6959 Other secondary thrombocytopenia: Secondary | ICD-10-CM | POA: Diagnosis present

## 2015-05-02 DIAGNOSIS — R4182 Altered mental status, unspecified: Secondary | ICD-10-CM | POA: Diagnosis present

## 2015-05-02 DIAGNOSIS — Z515 Encounter for palliative care: Secondary | ICD-10-CM | POA: Diagnosis present

## 2015-05-02 DIAGNOSIS — Z6825 Body mass index (BMI) 25.0-25.9, adult: Secondary | ICD-10-CM

## 2015-05-02 DIAGNOSIS — D689 Coagulation defect, unspecified: Secondary | ICD-10-CM | POA: Diagnosis present

## 2015-05-02 DIAGNOSIS — L8990 Pressure ulcer of unspecified site, unspecified stage: Secondary | ICD-10-CM | POA: Insufficient documentation

## 2015-05-02 DIAGNOSIS — L89899 Pressure ulcer of other site, unspecified stage: Secondary | ICD-10-CM | POA: Diagnosis present

## 2015-05-02 DIAGNOSIS — K7200 Acute and subacute hepatic failure without coma: Secondary | ICD-10-CM | POA: Diagnosis present

## 2015-05-02 DIAGNOSIS — K7030 Alcoholic cirrhosis of liver without ascites: Secondary | ICD-10-CM | POA: Diagnosis present

## 2015-05-02 DIAGNOSIS — E46 Unspecified protein-calorie malnutrition: Secondary | ICD-10-CM | POA: Diagnosis present

## 2015-05-02 DIAGNOSIS — Z8249 Family history of ischemic heart disease and other diseases of the circulatory system: Secondary | ICD-10-CM | POA: Diagnosis not present

## 2015-05-02 DIAGNOSIS — K7290 Hepatic failure, unspecified without coma: Secondary | ICD-10-CM | POA: Diagnosis not present

## 2015-05-02 DIAGNOSIS — Z833 Family history of diabetes mellitus: Secondary | ICD-10-CM

## 2015-05-02 DIAGNOSIS — Z79899 Other long term (current) drug therapy: Secondary | ICD-10-CM

## 2015-05-02 DIAGNOSIS — Z66 Do not resuscitate: Secondary | ICD-10-CM | POA: Diagnosis present

## 2015-05-02 DIAGNOSIS — E871 Hypo-osmolality and hyponatremia: Secondary | ICD-10-CM | POA: Diagnosis present

## 2015-05-02 DIAGNOSIS — I959 Hypotension, unspecified: Secondary | ICD-10-CM | POA: Diagnosis present

## 2015-05-02 DIAGNOSIS — E876 Hypokalemia: Secondary | ICD-10-CM | POA: Diagnosis present

## 2015-05-02 DIAGNOSIS — R17 Unspecified jaundice: Secondary | ICD-10-CM | POA: Diagnosis not present

## 2015-05-02 LAB — TYPE AND SCREEN
ABO/RH(D): A POS
Antibody Screen: NEGATIVE

## 2015-05-02 LAB — URINALYSIS COMPLETE WITH MICROSCOPIC (ARMC ONLY)
BACTERIA UA: NONE SEEN
RBC / HPF: NONE SEEN RBC/hpf (ref 0–5)
Specific Gravity, Urine: 1.015 (ref 1.005–1.030)

## 2015-05-02 LAB — COMPREHENSIVE METABOLIC PANEL
ALBUMIN: 1.9 g/dL — AB (ref 3.5–5.0)
ALK PHOS: 162 U/L — AB (ref 38–126)
ALT: 42 U/L (ref 14–54)
ANION GAP: 13 (ref 5–15)
AST: 117 U/L — ABNORMAL HIGH (ref 15–41)
BILIRUBIN TOTAL: 18.2 mg/dL — AB (ref 0.3–1.2)
BUN: 68 mg/dL — ABNORMAL HIGH (ref 6–20)
CALCIUM: 8.5 mg/dL — AB (ref 8.9–10.3)
CO2: 21 mmol/L — AB (ref 22–32)
Chloride: 94 mmol/L — ABNORMAL LOW (ref 101–111)
Creatinine, Ser: UNDETERMINED mg/dL (ref 0.44–1.00)
GLUCOSE: 113 mg/dL — AB (ref 65–99)
POTASSIUM: 2.6 mmol/L — AB (ref 3.5–5.1)
SODIUM: 128 mmol/L — AB (ref 135–145)
Total Protein: 6.9 g/dL (ref 6.5–8.1)

## 2015-05-02 LAB — BILIRUBIN, DIRECT: Bilirubin, Direct: 21.5 mg/dL — ABNORMAL HIGH (ref 0.1–0.5)

## 2015-05-02 LAB — CBC WITH DIFFERENTIAL/PLATELET
Basophils Absolute: 0 10*3/uL (ref 0–0.1)
EOS ABS: 0.1 10*3/uL (ref 0–0.7)
Eosinophils Relative: 1 %
HCT: 27.6 % — ABNORMAL LOW (ref 35.0–47.0)
Hemoglobin: 9.7 g/dL — ABNORMAL LOW (ref 12.0–16.0)
LYMPHS ABS: 0.8 10*3/uL — AB (ref 1.0–3.6)
Lymphocytes Relative: 5 %
MCH: 38.4 pg — AB (ref 26.0–34.0)
MCHC: 35.1 g/dL (ref 32.0–36.0)
MCV: 109.3 fL — ABNORMAL HIGH (ref 80.0–100.0)
MONO ABS: 0.8 10*3/uL (ref 0.2–0.9)
Neutro Abs: 14.6 10*3/uL — ABNORMAL HIGH (ref 1.4–6.5)
Neutrophils Relative %: 89 %
Platelets: 97 10*3/uL — ABNORMAL LOW (ref 150–440)
RBC: 2.52 MIL/uL — ABNORMAL LOW (ref 3.80–5.20)
RDW: 19.6 % — AB (ref 11.5–14.5)
WBC: 16.5 10*3/uL — ABNORMAL HIGH (ref 3.6–11.0)

## 2015-05-02 LAB — APTT: APTT: 57 s — AB (ref 24–36)

## 2015-05-02 LAB — AMMONIA: AMMONIA: 81 umol/L — AB (ref 9–35)

## 2015-05-02 LAB — PROTIME-INR
INR: 2.93
PROTHROMBIN TIME: 30.1 s — AB (ref 11.4–15.0)

## 2015-05-02 LAB — TROPONIN I: TROPONIN I: 0.1 ng/mL — AB (ref <0.031)

## 2015-05-02 LAB — ETHANOL

## 2015-05-02 MED ORDER — LORAZEPAM 2 MG/ML IJ SOLN
1.0000 mg | INTRAMUSCULAR | Status: DC | PRN
  Administered 2015-05-03: 1 mg via INTRAVENOUS
  Filled 2015-05-02: qty 1

## 2015-05-02 MED ORDER — ONDANSETRON HCL 4 MG PO TABS: 4.0000 mg | ORAL_TABLET | Freq: Four times a day (QID) | ORAL | Status: DC | PRN

## 2015-05-02 MED ORDER — LACTULOSE 10 GM/15ML PO SOLN
30.0000 g | Freq: Once | ORAL | Status: AC
  Administered 2015-05-02: 30 g via ORAL
  Filled 2015-05-02: qty 60

## 2015-05-02 MED ORDER — MORPHINE SULFATE (PF) 2 MG/ML IV SOLN
1.0000 mg | INTRAVENOUS | Status: DC | PRN
  Administered 2015-05-02 – 2015-05-03 (×3): 1 mg via INTRAVENOUS
  Filled 2015-05-02 (×3): qty 1

## 2015-05-02 MED ORDER — ONDANSETRON HCL 4 MG/2ML IJ SOLN: 4.0000 mg | Freq: Four times a day (QID) | INTRAMUSCULAR | Status: DC | PRN

## 2015-05-02 MED ORDER — MORPHINE SULFATE (PF) 2 MG/ML IV SOLN
INTRAVENOUS | Status: AC
  Filled 2015-05-02: qty 1

## 2015-05-02 MED ORDER — MORPHINE SULFATE (PF) 2 MG/ML IV SOLN
1.0000 mg | INTRAVENOUS | Status: DC | PRN
  Administered 2015-05-02: 1 mg via INTRAVENOUS

## 2015-05-02 MED ORDER — AMPICILLIN-SULBACTAM SODIUM 3 (2-1) G IJ SOLR
3.0000 g | Freq: Once | INTRAMUSCULAR | Status: AC
  Administered 2015-05-02: 3 g via INTRAVENOUS
  Filled 2015-05-02: qty 3

## 2015-05-02 MED ORDER — POTASSIUM CHLORIDE CRYS ER 20 MEQ PO TBCR
40.0000 meq | EXTENDED_RELEASE_TABLET | Freq: Once | ORAL | Status: AC
  Administered 2015-05-02: 40 meq via ORAL
  Filled 2015-05-02: qty 2

## 2015-05-02 MED ORDER — SODIUM CHLORIDE 0.9 % IV BOLUS (SEPSIS)
250.0000 mL | Freq: Once | INTRAVENOUS | Status: AC
  Administered 2015-05-02: 250 mL via INTRAVENOUS

## 2015-05-02 MED ORDER — SODIUM CHLORIDE 0.9 % IV BOLUS (SEPSIS): 500.0000 mL | Freq: Once | INTRAVENOUS | Status: DC

## 2015-05-02 MED ORDER — SODIUM CHLORIDE 0.9 % IV BOLUS (SEPSIS)
500.0000 mL | Freq: Once | INTRAVENOUS | Status: AC
Start: 2015-05-02 — End: 2015-05-02
  Administered 2015-05-02: 500 mL via INTRAVENOUS

## 2015-05-02 NOTE — ED Notes (Signed)
ams, weakness, hypotension.  Reports heavy ETOH use.  Pt very jaundice on arrival to ED

## 2015-05-02 NOTE — ED Notes (Signed)
Critical lab values potassium, bilirubin, and troponin, MD made aware

## 2015-05-02 NOTE — ED Provider Notes (Addendum)
[facility] Emergency Department Provider Note  ____________________________________________  Time seen: Approximately 2:04 PM  I have reviewed the triage vital signs and the nursing notes.   HISTORY  Chief Complaint Abdominal Pain    HPI Katie Castro is a 65 y.o. female with history of alcoholic cirrhosis who presents for evaluation of altered mental status and worsening jaundice over the past 3 days, gradual onset, constant since onset, severe. Her family at bedside reports that she stopped drinking alcohol approximately 3 weeks ago, prior to that she was very heavy drinker. Over the past 3 days her skin is becoming increasingly yellow, she is become more confused. No fevers or chills. No vomiting or diarrhea. No falls.   Past Medical History  Diagnosis Date  . Fibroid   . Infertility, female   . Cirrhosis, alcoholic [facility])     Patient Active Problem List   Diagnosis Date Noted  . Ulcer of heel ([place]) 10/21/2014  . S/P laparoscopic supracervical hysterectomy 08/17/2013  . S/P small bowel resection 08/17/2013  . H/O total hysterectomy 08/17/2013    Past Surgical History  Procedure Laterality Date  . Partial hysterectomy  2011    [facility]  . Foot surgery  05/2013  . Laparoscopy  1992  . Bowel resection  2011    herniation through port post op  . Abdominal sacrocolpopexy  2012    robotic-cervical prolapse  . Hardware removal Right 06/24/2014    Procedure: HARDWARE REMOVAL;  Surgeon: Albertine Patricia, DPM;  Location: [facility] ORS;  Service: Podiatry;  Laterality: Right;  1 SCREW EXPLANTED GIVEN TO PT    Current Outpatient Rx  Name  Route  Sig  Dispense  Refill  . hydrocortisone (ANUSOL-HC) 25 MG suppository   Rectal   Place 1 suppository (25 mg total) rectally 2 (two) times daily. Patient not taking: Reported on 10/18/2014   12 suppository   0   . spironolactone (ALDACTONE) 50 MG tablet   Oral    Take 1 tablet (50 mg total) by mouth 2 (two) times daily.   60 tablet   5     Allergies Review of patient's allergies indicates no known allergies.  Family History  Problem Relation Age of Onset  . Diabetes Father   . Diabetes Brother   . Heart attack Father     Social History Social History  Substance Use Topics  . Smoking status: Never Smoker   . Smokeless tobacco: Never Used  . Alcohol Use: No     Comment: pt has quit drinking    Review of Systems Constitutional: No fever/chills Eyes: No visual changes. ENT: No sore throat. Cardiovascular: Denies chest pain. Respiratory: Denies shortness of breath. Gastrointestinal: No abdominal pain.  No nausea, no vomiting.  No diarrhea.  No constipation. Genitourinary: Negative for dysuria. Musculoskeletal: Negative for back pain. Skin: Negative for rash. Neurological: Negative for headaches, focal weakness or numbness.  10-point ROS otherwise negative.  ____________________________________________   PHYSICAL EXAM:  Filed Vitals:   05/02/15 1420  BP: 100/51  Pulse: 73  Temp: 97.9 F (36.6 C)  Resp: 26  Height: 5\' 9"  (1.753 m)  Weight: 170 lb (77.111 kg)  SpO2: 100%     Constitutional: Alert and oriented to self and place only but not to year. She appears confused intermittently. The patient is severely ill appearing, fatigued but in no acute distress. He was to follow commands and answer very simple questions. Eyes: Conjunctivae are severely icteric. PERRL. EOMI.  Head: Atraumatic. Nose: No congestion/rhinnorhea. Mouth/Throat: Mucous membranes are moist.  Oropharynx non-erythematous. Neck: No stridor. Supple without meningismus. Cardiovascular: Normal rate, regular rhythm. Grossly normal heart sounds.  Good peripheral circulation. Respiratory: Normal respiratory effort.  No retractions. Diminished breath sounds bilateral bases. Gastrointestinal: Distended due to ascites but nontender,no rebound or  guarding. Genitourinary: deferred Musculoskeletal: 4+ edema bilateral lower extremities. Neurologic:  Normal speech without dysarthria. Skin:  Skin is warm, dry. Severe jaundice is noted throughout the entire body. Psychiatric: Mood and affect are normal. Speech and behavior are normal.  ____________________________________________   LABS (all labs ordered are listed, but only abnormal results are displayed)  Labs Reviewed  CBC WITH DIFFERENTIAL/PLATELET - Abnormal; Notable for the following:    WBC 16.5 (*)    RBC 2.52 (*)    Hemoglobin 9.7 (*)    HCT 27.6 (*)    MCV 109.3 (*)    MCH 38.4 (*)    RDW 19.6 (*)    Platelets 97 (*)    Neutro Abs 14.6 (*)    Lymphs Abs 0.8 (*)    All other components within normal limits  COMPREHENSIVE METABOLIC PANEL - Abnormal; Notable for the following:    Sodium 128 (*)    Potassium 2.6 (*)    Chloride 94 (*)    CO2 21 (*)    Glucose, Bld 113 (*)    BUN 68 (*)    Calcium 8.5 (*)    Albumin 1.9 (*)    AST 117 (*)    Alkaline Phosphatase 162 (*)    Total Bilirubin 18.2 (*)    All other components within normal limits  AMMONIA - Abnormal; Notable for the following:    Ammonia 81 (*)    All other components within normal limits  PROTIME-INR - Abnormal; Notable for the following:    Prothrombin Time 30.1 (*)    All other components within normal limits  APTT - Abnormal; Notable for the following:    aPTT 57 (*)    All other components within normal limits  TROPONIN I - Abnormal; Notable for the following:    Troponin I 0.10 (*)    All other components within normal limits  BILIRUBIN, DIRECT - Abnormal; Notable for the following:    Bilirubin, Direct 21.5 (*)    All other components within normal limits  URINALYSIS COMPLETEWITH MICROSCOPIC ([facility] ONLY) - Abnormal; Notable for the following:    Color, Urine AMBER (*)    APPearance CLOUDY (*)    Glucose, UA   (*)    Value: TEST NOT REPORTED DUE TO COLOR INTERFERENCE OF URINE PIGMENT    Bilirubin Urine   (*)    Value: TEST NOT REPORTED DUE TO COLOR INTERFERENCE OF URINE PIGMENT   Ketones, ur   (*)    Value: TEST NOT REPORTED DUE TO COLOR INTERFERENCE OF URINE PIGMENT   Hgb urine dipstick   (*)    Value: TEST NOT REPORTED DUE TO COLOR INTERFERENCE OF URINE PIGMENT   Protein, ur   (*)    Value: TEST NOT REPORTED DUE TO COLOR INTERFERENCE OF URINE PIGMENT   Nitrite   (*)    Value: TEST NOT REPORTED DUE TO COLOR INTERFERENCE OF URINE PIGMENT   Leukocytes, UA   (*)    Value: TEST NOT REPORTED DUE TO COLOR INTERFERENCE OF URINE PIGMENT   Squamous Epithelial / LPF 0-5 (*)    All other components within normal limits  CULTURE, BLOOD (ROUTINE X 2)  CULTURE, BLOOD (ROUTINE X 2)  ETHANOL  LACTIC ACID, PLASMA  LACTIC ACID, PLASMA  TYPE AND SCREEN  ABO/RH   ____________________________________________  EKG  ED ECG REPORT I, Joanne Gavel, the attending physician, personally viewed and interpreted this ECG.   Date: 05/02/2015  EKG Time: 13:59  Rate: 75  Rhythm: normal sinus rhythm with PVCs.  Axis: normal  Intervals: Nonspecific intraventricular conduction delay.  ST&T Change: No acute ST elevation.  ____________________________________________  RADIOLOGY  CXR IMPRESSION: Patchy infiltrate right base. Lungs elsewhere clear. Stable cardiomegaly. Given the history, question a degree of aspiration in the right base. ____________________________________________   PROCEDURES  Procedure(s) performed: None  Critical Care performed: No  ____________________________________________   INITIAL IMPRESSION / ASSESSMENT AND PLAN / ED COURSE  Pertinent labs & imaging results that were available during my care of the patient were reviewed by me and considered in my medical decision making (see chart for details).  Katie Castro is a 65 y.o. female with history of alcoholic cirrhosis who presents for evaluation of altered mental status and worsening jaundice  over the past 3 days. On exam, she is severely ill appearing with the most severe jaundice I have ever seen. She has anasarca, abdominal ascites, and I'm concerned for  fulminant liver failure. She is mildly hypotensive with blood pressure 100/51 however she is afebrile and the remainder of her vital signs are stable. Plan for labs, chest x-ray, admission. We'll gently hydrate but will not give aggressive IV fluids due to risk of precipitating flash pulmonary edema.  ----------------------------------------- 3:52 PM on 05/02/2015 ----------------------------------------- CBC is notable for leukocytosis, anemia, from a cytopenia. Ammonia elevated at 81, will give lactulose. INR elevated at 2.93 concerning for  coagulopathy. Chest x-ray shows possible right-sided infiltrate with question of aspiration, Unasyn given. CMP is notable for hyponatremia, hypokalemia, bilirubin of 18. Ammonia is elevated at 81, we'll order lactulose. Troponin elevated 0.10, question demand ischemia. Direct bilirubin was 21. Negative alcohol. I discussed the case with Dr. Rayann Heman of GI who reports that patient is in fulminant liver failure and will likely die without liver transplant though she is unlikely to be a candidate for transplant due to recent alcohol use. He recommends transfer to tertiary care center for evaluation for liver transplant. I discussed this with the patient as well as her family at bedside and her healthcare power of attorney Mr. Luvenia Heller who is also at bedside. I told them specifically that Katie Castro would likely die without transplant and that the only hope would be to transfer her to a tertiary care center for evaluation for liver transplant. Katie Castro and Mr. Luvenia Heller report to me that they do not desire transfer to a tertiary care center that they understand exactly how unlikely it is that the patient will receive a liver transplant. They will be admitted here for conservative therapy and I discussed with them that we  would consult palliative care. They voice understanding of this. Katie Castro reports "I just want to go home" but she is amenable to stay in the hospital tonight.  Case discussed with hospitalist, Dr. Verdell Carmine for admission.  ____________________________________________   FINAL CLINICAL IMPRESSION(S) / ED DIAGNOSES  Final diagnoses:  Liver failure, acute  Altered mental status, unspecified altered mental status type      Joanne Gavel, MD 05/02/15 1516  Joanne Gavel, MD 05/02/15 1557

## 2015-05-02 NOTE — H&P (Signed)
[place] [facility] [place] NAME: Katie Castro    MR#:  [id number]  DATE OF BIRTH:  14-Apr-1950  DATE OF ADMISSION:  05/02/2015  PRIMARY CARE PHYSICIAN: Tracie Harrier, MD   REQUESTING/REFERRING PHYSICIAN: Dr. Darrick Penna  CHIEF COMPLAINT:   Chief Complaint  Patient presents with  . Abdominal Pain    HISTORY OF PRESENT ILLNESS:  Katie Castro  is a 65 y.o. female with a known history of Alcohol abuse, alcoholic liver cirrhosis who presents to the hospital from home brought in by her ex-husband due to generalized weakness, abdominal pain. Patient has a history of alcoholic liver cirrhosis but has not drank in almost over a month. She has progressively declined with progressive jaundice, significant weakness, poor by mouth intake. The husband advised to come to the ER for further evaluation. In the ER patient was noted to be severely jaundiced, coagulopathic with several electrolyte abnormalities and noted to be in fulminant hepatic failure. Patient has a very poor prognosis and after discussion with the patient's POA who is the husband and he has agreed to admission under palliative/hospice placement.  PAST MEDICAL HISTORY:   Past Medical History  Diagnosis Date  . Fibroid   . Infertility, female   . Cirrhosis, alcoholic ([place])     PAST SURGICAL HISTORY:   Past Surgical History  Procedure Laterality Date  . Partial hysterectomy  2011    laparoscopic, [facility]ird [facility]  . Foot surgery  05/2013  . Laparoscopy  1992  . Bowel resection  2011    herniation through port post op  . Abdominal sacrocolpopexy  2012    robotic-cervical prolapse  . Hardware removal Right 06/24/2014    Procedure: HARDWARE REMOVAL;  Surgeon: Albertine Patricia, DPM;  Location: [facility] ORS;  Service: Podiatry;  Laterality: Right;  1 SCREW EXPLANTED GIVEN TO PT    SOCIAL HISTORY:   Social History  Substance Use Topics  . Smoking status: Never  Smoker   . Smokeless tobacco: Never Used  . Alcohol Use: 6.0 oz/week    10 Standard drinks or equivalent per week     Comment: pt has quit drinking, used to drink 1-2 bottles/day.      FAMILY HISTORY:   Family History  Problem Relation Age of Onset  . Diabetes Father   . Diabetes Brother   . Heart attack Father     DRUG ALLERGIES:  No Known Allergies  REVIEW OF SYSTEMS:   Review of Systems  Unable to perform ROS: mental acuity    MEDICATIONS AT HOME:   Prior to Admission medications   Not on File      VITAL SIGNS:  Blood pressure 100/51, pulse 73, temperature 97.9 F (36.6 C), resp. rate 26, height 5\' 9"  (1.753 m), weight 77.111 kg (170 lb), last menstrual period 01/14/2006, SpO2 100 %.  PHYSICAL EXAMINATION:  Physical Exam  GENERAL:  65 y.o.-year-old patient Severely jaundiced, cachectic lethargic lying in bed.  EYES: Pupils equal, round, reactive to light and accommodation. Positive scleral icterus. Extraocular muscles intact.  HEENT: Head atraumatic, normocephalic. Oropharynx and nasopharynx clear. No oropharyngeal erythema, dry oral mucosa  NECK:  Supple, no jugular venous distention. No thyroid enlargement, no tenderness.  LUNGS: Poor respiratory effort, no wheezing, rales, rhonchi. No use of accessory muscles of respiration.  CARDIOVASCULAR: S1, S2 RRR. No murmurs, rubs, gallops, clicks.  ABDOMEN: Soft, nontender, distended, positive fluid wave consistent with ascites. Bowel sounds present.EXTREMITIES: +2-3 edema b/l, No cyanosis, or clubbing. +  2 pedal & radial pulses b/l.   NEUROLOGIC: Cranial nerves II through XII are intact. No focal Motor or sensory deficits appreciated b/l.  Globally weak.   PSYCHIATRIC: The patient is alert and oriented x 1. Flat affect.  SKIN: No obvious rash, lesion, or ulcer.   LABORATORY PANEL:   CBC  Recent Labs Lab 05/02/15 1359  WBC 16.5*  HGB 9.7*  HCT 27.6*  PLT 97*    ------------------------------------------------------------------------------------------------------------------  Chemistries   Recent Labs Lab 05/02/15 1359  NA 128*  K 2.6*  CL 94*  CO2 21*  GLUCOSE 113*  BUN 68*  CREATININE UNABLE TO REPORT DUE TO ICTERUS   CALCIUM 8.5*  AST 117*  ALT 42  ALKPHOS 162*  BILITOT 18.2*   ------------------------------------------------------------------------------------------------------------------  Cardiac Enzymes  Recent Labs Lab 05/02/15 1359  TROPONINI 0.10*   ------------------------------------------------------------------------------------------------------------------  RADIOLOGY:  Dg Chest Portable 1 View  05/02/2015  CLINICAL DATA:  Altered mental status. Hypotension. Heavy alcohol use. EXAM: PORTABLE CHEST 1 VIEW COMPARISON:  January 29, 2014 FINDINGS: There is stable cardiomegaly with pulmonary vascularity within normal limits. There is patchy infiltrate in the right base. Lungs elsewhere clear. No bone lesions. No adenopathy. IMPRESSION: Patchy infiltrate right base. Lungs elsewhere clear. Stable cardiomegaly. Given the history, question a degree of aspiration in the right base. Electronically Signed   By: Lowella Grip III M.D.   On: 05/02/2015 14:32     IMPRESSION AND PLAN:   65 year old female with past medical history of severe alcohol abuse, alcoholic liver cirrhosis who presents to the hospital due to generalized weakness, by mouth when necessary take and abdominal pain and noted to be in fulminant hepatic failure.  1. Fulminant hepatic failure 2. Alcoholic liver cirrhosis 3. Hyponatremia 4. Hypokalemia 5. Abnormal LFT's 6. Thrombocytopenia  Patient presented in fulminant hepatic failure and has a very poor prognosis. I had a long discussion with the patient's ex-husband who is the patient's POA who does not want to pursue aggressive treatment and wants to keep her comfortable. -Patient is being admitted  under hospice/palliative care. -I will get palliative care consult, place on some as needed Ativan, morphine. -Continue supportive care.    All the records are reviewed and case discussed with ED provider. Management plans discussed with the patient, family and they are in agreement.  CODE STATUS: DNR  TOTAL TIME TAKING CARE OF THIS PATIENT: 50 minutes.   Greater than 50% time spent in coordination of care and discussing plan of care with the patient's husband, palliative care physician, and hospice liaison.   Henreitta Leber M.D on 05/02/2015 at 4:57 PM  Between 7am to 6pm - Pager - [phone]  After 6pm go to [email or web address] - password [email or web address] [place] [facility]  Office  [phone]  CC: Primary care physician; Tracie Harrier, MD

## 2015-05-02 NOTE — Progress Notes (Signed)
Chaplain provided a compassionate presence and spiritual support to the patient and family. Chaplain Brittainy Bucker [phone] 

## 2015-05-02 NOTE — Progress Notes (Signed)
Pallaitive Care Update  Am aware of consult and have spoken with Dr. Verdell Carmine and also with Hospice Liaison.  Plant is for Hospice in the home.  I will be available if help is needed to address symptom management / med orders.    Colleen Can, MD

## 2015-05-02 NOTE — Progress Notes (Signed)
New referral for [facility] at home following discharge. Writer called to the ED by Dr. Verdell Carmine following a conversation with MS. Florene Glen and her ex-husband Education officer, community) Luvenia Heller. Plan is for Ms. Gasparyan to be admitted to [facility] tonight. Luvenia Heller would like to take her home as soon as hospice services can be arranged. All information faxed to referral intake. Writer to meet again with Luvenia Heller in the morning and assess for equipment needs prior to discharge. Thank you. Flo Shanks RN, BSN, [place] [facility] [place], [facility] [phone] c

## 2015-05-02 NOTE — Progress Notes (Signed)
Pt had lactic acid blood draw ordered. Family does not wish for pt to be stuck. Dr.Willis and lab made aware.

## 2015-05-03 DIAGNOSIS — D689 Coagulation defect, unspecified: Secondary | ICD-10-CM

## 2015-05-03 DIAGNOSIS — D696 Thrombocytopenia, unspecified: Secondary | ICD-10-CM

## 2015-05-03 DIAGNOSIS — R631 Polydipsia: Secondary | ICD-10-CM

## 2015-05-03 DIAGNOSIS — E876 Hypokalemia: Secondary | ICD-10-CM

## 2015-05-03 DIAGNOSIS — Z66 Do not resuscitate: Secondary | ICD-10-CM

## 2015-05-03 DIAGNOSIS — Z515 Encounter for palliative care: Secondary | ICD-10-CM

## 2015-05-03 DIAGNOSIS — K7290 Hepatic failure, unspecified without coma: Secondary | ICD-10-CM

## 2015-05-03 DIAGNOSIS — K7030 Alcoholic cirrhosis of liver without ascites: Secondary | ICD-10-CM

## 2015-05-03 DIAGNOSIS — L8990 Pressure ulcer of unspecified site, unspecified stage: Secondary | ICD-10-CM | POA: Insufficient documentation

## 2015-05-03 DIAGNOSIS — E871 Hypo-osmolality and hyponatremia: Secondary | ICD-10-CM

## 2015-05-03 DIAGNOSIS — R630 Anorexia: Secondary | ICD-10-CM

## 2015-05-03 DIAGNOSIS — E46 Unspecified protein-calorie malnutrition: Secondary | ICD-10-CM

## 2015-05-03 DIAGNOSIS — R17 Unspecified jaundice: Secondary | ICD-10-CM

## 2015-05-03 LAB — ABO/RH: ABO/RH(D): A POS

## 2015-05-03 MED ORDER — MORPHINE SULFATE (CONCENTRATE) 10 MG/0.5ML PO SOLN: 5.0000 mg | ORAL | Status: DC | PRN

## 2015-05-03 MED ORDER — LORAZEPAM 1 MG PO TABS: 1.0000 mg | ORAL_TABLET | ORAL | Status: AC | PRN

## 2015-05-03 MED ORDER — MORPHINE SULFATE (PF) 2 MG/ML IV SOLN
2.0000 mg | INTRAVENOUS | Status: DC | PRN
  Administered 2015-05-03 (×2): 2 mg via INTRAVENOUS
  Filled 2015-05-03 (×2): qty 1

## 2015-05-03 MED ORDER — BISACODYL 10 MG RE SUPP: 10.0000 mg | Freq: Once | RECTAL | Status: DC

## 2015-05-03 MED ORDER — MORPHINE SULFATE (CONCENTRATE) 10 MG/0.5ML PO SOLN: 5.0000 mg | ORAL | Status: AC | PRN

## 2015-05-03 MED ORDER — MORPHINE SULFATE (CONCENTRATE) 10 MG/0.5ML PO SOLN
5.0000 mg | Freq: Once | ORAL | Status: AC
  Administered 2015-05-03: 5 mg via ORAL
  Filled 2015-05-03: qty 1
  Filled 2015-05-03: qty 0.5

## 2015-05-03 NOTE — Care Management (Signed)
Spoke with Judson Roch CSW and she will provide EMS packet for discharge by EMS to home.

## 2015-05-03 NOTE — Care Management (Signed)
Admitted to [facility] with the diagnosis of Hepatic failure. Ex-husband is Kayren Eaves [phone]). Caregivers are in the home. Last seen Dr. Ginette Pitman in October 2016. Rolling walker, fot scooter  and cane in the home, if needed. Fair appetite. No Home Health in the past. No skilled facility. No home oxygen. [place] prior to presenting to hospital. Working until about 3 months ago. Mr. Joneen Caraway is working on getting private sitters in the home for 24/7. Also, states that hospice is suppose to deliver bed today. Will transport per car to home. Shelbie Ammons RN MSN CCM Care Management [phone]

## 2015-05-03 NOTE — Progress Notes (Signed)
Writer met in the family room with Katie Castro to officially initiate education regarding hospice services, philosophy and team approach to care with good understanding voiced. Hospice contact information and folder given. Katie Castro feels that it would be safer for Katie Castro to transfer home via EMS rather that by car.  Bed has been delivered. Katie Castro has requested that EMS be called for transport at 2 pm. Updated information faxed to referral intake. Plan remains for same day admission to hospice services. Writer updated staff RN Gerald Stabs and CSW Judson Roch. Thank you. Flo Shanks RN, BSN, [place] [facility] [place], [facility] [phone] c

## 2015-05-03 NOTE — Discharge Summary (Signed)
[place] [facility] [place] NAME: Katie Castro    MR#:  [id number]  DATE OF BIRTH:  10-04-1950  DATE OF ADMISSION:  05/02/2015 ADMITTING PHYSICIAN: Henreitta Leber, MD  DATE OF DISCHARGE: No discharge date for patient encounter.  PRIMARY CARE PHYSICIAN: HANDE,VISHWANATH, MD   ADMISSION DIAGNOSIS:  Liver failure, acute [K72.00] Altered mental status, unspecified altered mental status type [R41.82]  DISCHARGE DIAGNOSIS:  Active Problems:   Fulminant hepatic failure (HCC)   Pressure ulcer   SECONDARY DIAGNOSIS:   Past Medical History  Diagnosis Date  . Fibroid   . Infertility, female   . Cirrhosis, alcoholic ([place])      ADMITTING HISTORY  Katie Castro is a 65 y.o. female with a known history of Alcohol abuse, alcoholic liver cirrhosis who presents to the hospital from home brought in by her ex-husband due to generalized weakness, abdominal pain. Patient has a history of alcoholic liver cirrhosis but has not drank in almost over a month. She has progressively declined with progressive jaundice, significant weakness, poor by mouth intake. The husband advised to come to the ER for further evaluation. In the ER patient was noted to be severely jaundiced, coagulopathic with several electrolyte abnormalities and noted to be in fulminant hepatic failure. Patient has a very poor prognosis and after discussion with the patient's POA who is the husband and he has agreed to admission under palliative/hospice placement.  HOSPITAL COURSE:   65 year old female with past medical history of severe alcohol abuse, alcoholic liver cirrhosis who presents to the hospital due to generalized weakness, by mouth when necessary take and abdominal pain and noted to be in fulminant hepatic failure.  1. Fulminant hepatic failure 2. Alcoholic liver cirrhosis 3. Hyponatremia 4. Hypokalemia 5. Abnormal LFT's 6. Thrombocytopenia  After discussing with  HCPOA Dr. Verdell Carmine initiated comfort measures with as needed morphine and Ativan due to extremely poor prognosis as per prior patient wishes and HCPOA wishes Seen by hospice nurse. Patient wished for hospice at home. This was set up. Patient is being discharged to Home with hospice services.  CONSULTS OBTAINED:     DRUG ALLERGIES:  No Known Allergies  DISCHARGE MEDICATIONS:   Current Discharge Medication List    START taking these medications   Details  LORazepam (ATIVAN) 1 MG tablet Take 1 tablet (1 mg total) by mouth every 4 (four) hours as needed for anxiety. Qty: 30 tablet, Refills: 0    Morphine Sulfate (MORPHINE CONCENTRATE) 10 MG/0.5ML SOLN concentrated solution Take 0.25-0.5 mLs (5-10 mg total) by mouth every hour as needed for moderate pain, severe pain or shortness of breath. Qty: 30 mL, Refills: 0        Today   VITAL SIGNS:  Blood pressure 94/42, pulse 71, temperature 98 F (36.7 C), temperature source Oral, resp. rate 17, height 5\' 9"  (1.753 m), weight 77.111 kg (170 lb), last menstrual period 01/14/2006, SpO2 100 %.  I/O:   Intake/Output Summary (Last 24 hours) at 05/03/15 1055 Last data filed at 05/03/15 [id number]  Gross per 24 hour  Intake    120 ml  Output    950 ml  Net   -830 ml    PHYSICAL EXAMINATION:  Physical Exam  GENERAL:  65 y.o.-year-old patient lying in the bed looks critically ill. Opens eyes on calling her name PSYCHIATRIC: Drowzy SKIN: Icterus  DATA REVIEW:   CBC  Recent Labs Lab 05/02/15 1359  WBC 16.5*  HGB 9.7*  HCT 27.6*  PLT 97*    Chemistries   Recent Labs Lab 05/02/15 1359  NA 128*  K 2.6*  CL 94*  CO2 21*  GLUCOSE 113*  BUN 68*  CREATININE UNABLE TO REPORT DUE TO ICTERUS   CALCIUM 8.5*  AST 117*  ALT 42  ALKPHOS 162*  BILITOT 18.2*    Cardiac Enzymes  Recent Labs Lab 05/02/15 1359  TROPONINI 0.10*    Microbiology Results  Results for orders placed or performed during the hospital encounter of  05/02/15  Blood culture (routine x 2)     Status: None (Preliminary result)   Collection Time: 05/02/15  1:59 PM  Result Value Ref Range Status   Specimen Description BLOOD LEFT ASSIST CONTROL  Final   Special Requests BOTTLES DRAWN AEROBIC AND ANAEROBIC [id number]  Final   Culture NO GROWTH < 24 HOURS  Final   Report Status PENDING  Incomplete    RADIOLOGY:  Dg Chest Portable 1 View  05/02/2015  CLINICAL DATA:  Altered mental status. Hypotension. Heavy alcohol use. EXAM: PORTABLE CHEST 1 VIEW COMPARISON:  January 29, 2014 FINDINGS: There is stable cardiomegaly with pulmonary vascularity within normal limits. There is patchy infiltrate in the right base. Lungs elsewhere clear. No bone lesions. No adenopathy. IMPRESSION: Patchy infiltrate right base. Lungs elsewhere clear. Stable cardiomegaly. Given the history, question a degree of aspiration in the right base. Electronically Signed   By: Lowella Grip III M.D.   On: 05/02/2015 14:32    Follow up with PCP in 1 week.  Management plans discussed with the patient, family and they are in agreement.  CODE STATUS:     Code Status Orders        Start     Ordered   05/02/15 1800  Do not attempt resuscitation (DNR)   Continuous    Question Answer Comment  In the event of cardiac or respiratory ARREST Do not call a "code blue"   In the event of cardiac or respiratory ARREST Do not perform Intubation, CPR, defibrillation or ACLS   In the event of cardiac or respiratory ARREST Use medication by any route, position, wound care, and other measures to relive pain and suffering. May use oxygen, suction and manual treatment of airway obstruction as needed for comfort.      05/02/15 1759    Code Status History    Date Active Date Inactive Code Status Order ID Comments User Context   This patient has a current code status but no historical code status.    Advance Directive Documentation        Most Recent Value   Type of Advance  Directive  Healthcare Power of Attorney   Pre-existing out of facility DNR order (yellow form or pink MOST form)     "MOST" Form in Place?        TOTAL TIME TAKING CARE OF THIS PATIENT ON DAY OF DISCHARGE: more than 30 minutes.   Hillary Bow R M.D on 05/03/2015 at 10:55 AM  Between 7am to 6pm - Pager - [phone]  After 6pm go to [email or web address] - password [email or web address] [place] [facility]  Office  [phone]  CC: Primary care physician; Tracie Harrier, MD  Note: This dictation was prepared with Dragon dictation along with smaller phrase technology. Any transcriptional errors that result from this process are unintentional.

## 2015-05-03 NOTE — Clinical Social Work Note (Signed)
Pt is ready for discharge today and will return home with hospice services. Pt required EMS transportation home. CSW assisted and prepared discharge packet. CSW confirmed address with pt's caregivers. CSW updated RN and Hospice Liaision. CSW is signing off as no further needs identified.   Katie Castro, MSW, LCSW  Clinical Social Worker [phone]

## 2015-05-03 NOTE — Progress Notes (Signed)
Follow up visit made to new referral for hospice of [place] services at home following discharge.Patient was admitted to [facility] from home on 4/18, found to be in fulminating liver failure. She has requested no further treatment and wishes to return home. Her ex-husband Katie Castro will be her primary caregiver as well as hired caregivers. Patient seen lying in bed, severe jaundice noted, she had just gotten back to the bed with assistance after using the bed side commode. She is slow to respond, oriented to person.  Plan remains for her to return home, per [facility] Cora Collum [facility]) intends to take her home via car. She has been receiving IV morphine for management of pain and has required an increase in this dose. Per discussion with Palliative Medicine physician Dr. Megan Salon, she will be transitioned to liquid morphine prior to discharge. Prescription for liquid morphine in place on patient's chart.  DME requested: Hospital bed and [facility], ordered with hospice triage this morning, to be delivered ASAP. Writer contacted Katie Castro via phone [phone]), message left regarding equipment delivery. Hospital care team all aware of discharge plan. Signed DNR in place on patient's chart. Katie Shanks RN, BSN, [place] and [facility] of [place], [facility] [phone] c

## 2015-05-03 NOTE — Progress Notes (Signed)
Received MD order to discharge patient to home with Hospice, discharge instructions and prescriptions reviewed with patient and patient power of attorney, discharged via EMS in stretcher to home

## 2015-05-03 NOTE — Progress Notes (Signed)
EMS notified for transport, Staff RN [facility] aware. Patient has been premedicated for transport. Thank you.  Flo Shanks RN, BSN, [facility] [place], hospital liaison [phone] c

## 2015-05-03 NOTE — Consult Note (Signed)
Palliative Medicine Inpatient Consult Note   Name: Katie Castro Date: 05/03/2015 MRN: [id number]  DOB: 16-Feb-1950  Referring Physician: No att. providers found  Palliative Care consult requested for this 65 y.o. female for goals of medical therapy in patient with end stage alcoholic cirrhosis.  TODAY'S PLAN: I was asked to address discharge medications to help Hospice staff treat pt's pain in the home setting.  I examined pt and talked with the hired nurse caregiver about pts symptoms.   I have communicated with the Hospice Liaison (Hospice already involved with pt at time of my consult visit to pt), attending, and nursing staff.  Rxs printed and signed.    IMPRESSION: 1. End stage alcoholic cirrhosis ----with jaundice and coagulopathy and fulminant liver failure 2. Weakness 3.  Malnutrition and loss of appetite 4. Hyponatremia 5.  Hypokalemia 6.  Thrombocytopenia due to cirrhosis    REVIEW OF SYSTEMS:  Patient is not able to provide ROS due to nearing death  SPIRITUAL SUPPORT SYSTEM: Yes  Family and paid nurses.  SOCIAL HISTORY:  reports that she has never smoked. She has never used smokeless tobacco. She reports that she drinks about 6.0 oz of alcohol per week. She reports that she does not use illicit drugs.  LEGAL DOCUMENTS:  DNR form  CODE STATUS: DNR  PAST MEDICAL HISTORY: Past Medical History  Diagnosis Date  . Fibroid   . Infertility, female   . Cirrhosis, alcoholic ([place])     PAST SURGICAL HISTORY:  Past Surgical History  Procedure Laterality Date  . Partial hysterectomy  2011    laparoscopic, fiboird uterus-[facility]  . Foot surgery  05/2013  . Laparoscopy  1992  . Bowel resection  2011    herniation through port post op  . Abdominal sacrocolpopexy  2012    robotic-cervical prolapse  . Hardware removal Right 06/24/2014    Procedure: HARDWARE REMOVAL;  Surgeon: Albertine Patricia, DPM;  Location: [facility] ORS;  Service: Podiatry;   Laterality: Right;  1 SCREW EXPLANTED GIVEN TO PT    ALLERGIES:  has No Known Allergies.  MEDICATIONS:  Current Facility-Administered Medications  Medication Dose Route Frequency Provider Last Rate Last Dose  . bisacodyl (DULCOLAX) suppository 10 mg  10 mg Rectal Once Colleen Can, MD   10 mg at 05/03/15 1045  . LORazepam (ATIVAN) injection 1 mg  1 mg Intravenous Q2H PRN Henreitta Leber, MD   1 mg at 05/03/15 1314  . morphine 2 MG/ML injection 2 mg  2 mg Intravenous Q1H PRN Hillary Bow, MD   2 mg at 05/03/15 1442  . morphine CONCENTRATE 10 MG/0.5ML oral solution 5-10 mg  5-10 mg Oral Q1H PRN Colleen Can, MD      . ondansetron [facility]) injection 4 mg  4 mg Intravenous Q6H PRN Henreitta Leber, MD       Current Outpatient Prescriptions  Medication Sig Dispense Refill  . LORazepam (ATIVAN) 1 MG tablet Take 1 tablet (1 mg total) by mouth every 4 (four) hours as needed for anxiety. 30 tablet 0  . Morphine Sulfate (MORPHINE CONCENTRATE) 10 MG/0.5ML SOLN concentrated solution Take 0.25-0.5 mLs (5-10 mg total) by mouth every hour as needed for moderate pain, severe pain or shortness of breath. 30 mL 0    Vital Signs: BP 94/42 mmHg  Pulse 71  Temp(Src) 98 F (36.7 C) (Oral)  Resp 17  Ht 5\' 9"  (1.753 m)  Wt 77.111 kg (170 lb)  BMI 25.09 kg/m2  SpO2  100%  LMP 01/14/2006 Filed Weights   05/02/15 1420  Weight: 77.111 kg (170 lb)    Estimated body mass index is 25.09 kg/(m^2) as calculated from the following:   Height as of this encounter: 5\' 9"  (1.753 m).   Weight as of this encounter: 77.111 kg (170 lb).  PERFORMANCE STATUS (ECOG) : 4 - Bedbound  PHYSICAL EXAM: Pt is ashen gray/ jaundiced  Unresponsive Breathing is even but shallow Hrt rrr tachy Lungs with decreased BS bases ant. Abd protuberant with decreased BS Ext cool and dry   LABS: CBC:    Component Value Date/Time   WBC 16.5* 05/02/2015 1359   WBC 5.2 10/24/2014 1415   WBC 7.8 02/03/2014 0604   HGB  9.7* 05/02/2015 1359   HGB 10.7* 02/03/2014 0604   HGB 13.2 08/17/2013 1615   HCT 27.6* 05/02/2015 1359   HCT 32.2* 10/24/2014 1415   HCT 32.4* 02/03/2014 0604   PLT 97* 05/02/2015 1359   PLT 160 10/24/2014 1415   PLT 190 02/03/2014 0604   MCV 109.3* 05/02/2015 1359   MCV 98* 10/24/2014 1415   MCV 99 02/03/2014 0604   NEUTROABS 14.6* 05/02/2015 1359   NEUTROABS 4.1 10/24/2014 1415   NEUTROABS 3.6 02/03/2014 0604   LYMPHSABS 0.8* 05/02/2015 1359   LYMPHSABS 0.4* 10/24/2014 1415   LYMPHSABS 1.7 02/03/2014 0604   MONOABS 0.8 05/02/2015 1359   MONOABS 1.1* 02/03/2014 0604   EOSABS 0.1 05/02/2015 1359   EOSABS 0.0 10/24/2014 1415   EOSABS 1.3* 02/03/2014 0604   BASOSABS 0.0 05/02/2015 1359   BASOSABS 0.0 10/24/2014 1415   BASOSABS 0.1 02/03/2014 0604   BASOSABS 1 01/31/2014 0406   Comprehensive Metabolic Panel:    Component Value Date/Time   NA 128* 05/02/2015 1359   NA 143 02/03/2014 0604   K 2.6* 05/02/2015 1359   K 4.6 02/03/2014 0604   CL 94* 05/02/2015 1359   CL 111* 02/03/2014 0604   CO2 21* 05/02/2015 1359   CO2 24 02/03/2014 0604   BUN 68* 05/02/2015 1359   BUN 23* 02/03/2014 0604   CREATININE UNABLE TO REPORT DUE TO ICTERUS  05/02/2015 1359   CREATININE 1.16 02/04/2014 0634   GLUCOSE 113* 05/02/2015 1359   GLUCOSE 93 02/03/2014 0604   CALCIUM 8.5* 05/02/2015 1359   CALCIUM 9.0 02/03/2014 0604   AST 117* 05/02/2015 1359   AST 31 01/29/2014 1652   ALT 42 05/02/2015 1359   ALT 21 01/29/2014 1652   ALKPHOS 162* 05/02/2015 1359   ALKPHOS 260* 01/29/2014 1652   BILITOT 18.2* 05/02/2015 1359   BILITOT 3.3* 01/29/2014 1652   PROT 6.9 05/02/2015 1359   PROT 6.8 01/29/2014 1652   ALBUMIN 1.9* 05/02/2015 1359   ALBUMIN 2.4* 01/29/2014 1652    More than 50% of the visit was spent in counseling/coordination of care: Yes  Time Spent: 55 minutes

## 2015-05-07 LAB — CULTURE, BLOOD (ROUTINE X 2): CULTURE: NO GROWTH

## 2015-05-15 DEATH — deceased

## 2015-05-21 DIAGNOSIS — K7031 Alcoholic cirrhosis of liver with ascites: Secondary | ICD-10-CM | POA: Diagnosis not present

## 2015-05-21 DIAGNOSIS — R64 Cachexia: Secondary | ICD-10-CM | POA: Diagnosis not present

## 2015-05-21 DIAGNOSIS — F1021 Alcohol dependence, in remission: Secondary | ICD-10-CM | POA: Diagnosis not present

## 2015-05-21 DIAGNOSIS — K7290 Hepatic failure, unspecified without coma: Secondary | ICD-10-CM | POA: Diagnosis not present

## 2016-01-09 IMAGING — CR RIGHT FOOT COMPLETE - 3+ VIEW
1 series · 3 of 3 positions shown · non-contrast
Comparison: None.

CLINICAL DATA: Chronic right foot pain and swelling. Open wound
dorsally at the level of the 1st metatarsal. Prior surgery.

EXAM:
RIGHT FOOT COMPLETE - 3+ VIEW

[Series 1: ap · 0.17mm/px · 3 of 3 slices shown]
[im 1/3]
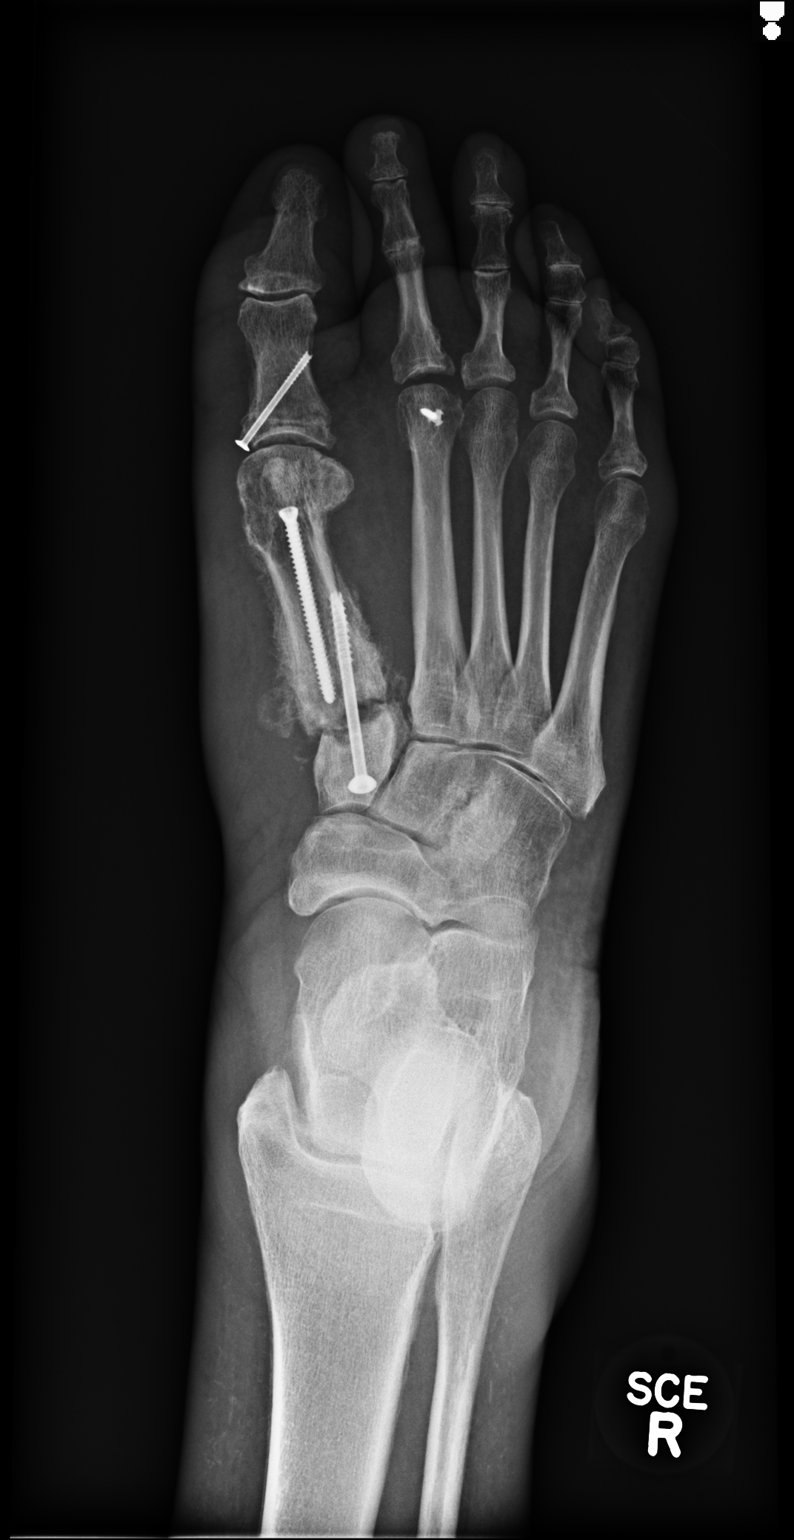
[im 2/3]
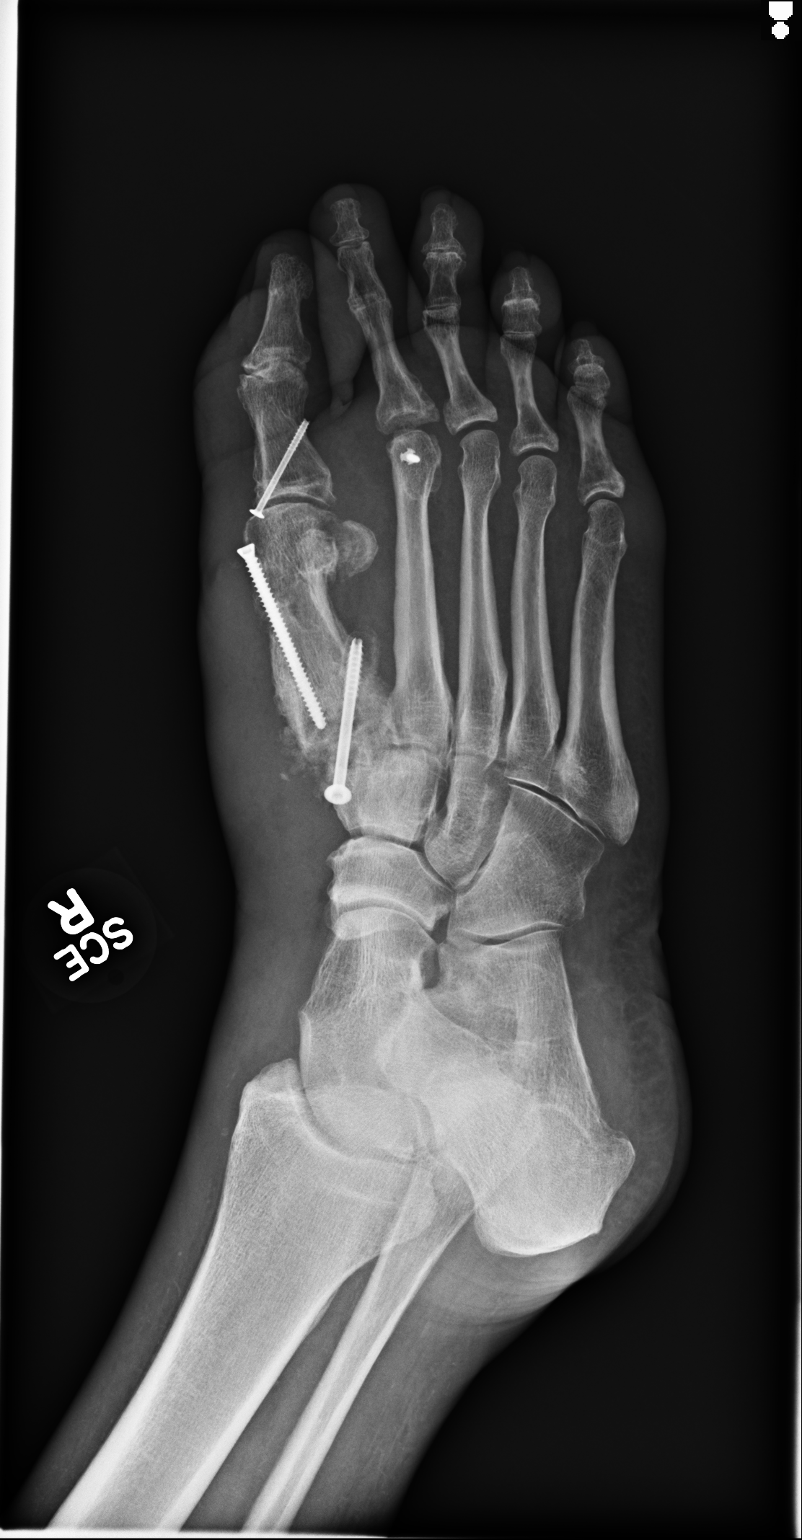
[im 3/3]
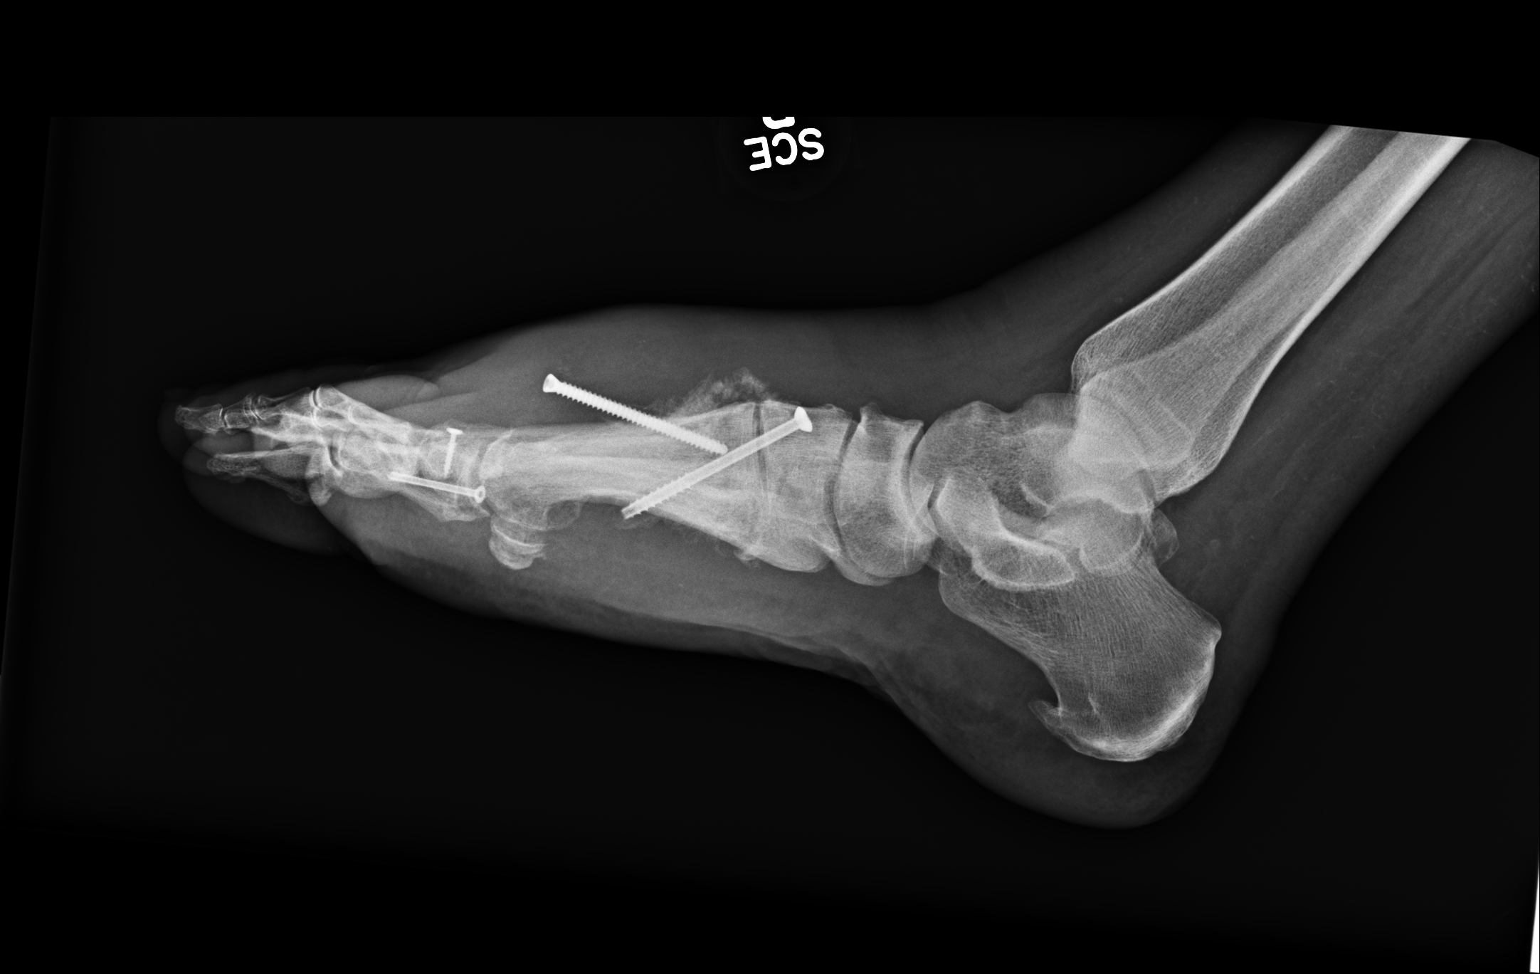

[3 of 3 positions shown; findings below may reference images not displayed]

FINDINGS: No evidence of acute fracture or dislocation. Apparent prior ORIF of
a fracture at the base of the proximal phalanx of the great toe and
prior attempted fusion of the medial cuneiform and 1st metatarsal
with nonunion. Irregular periosteal reaction involving the proximal
[DATE] of the 1st metatarsal. Marked medial and dorsal soft tissue
swelling.
IMPRESSION: 1. Osteomyelitis involving the 1st metatarsal.
2. Nonunion of the apparent attempted fusion of the medial cuneiform
and 1st metatarsal.

## 2016-01-13 IMAGING — CR DG CHEST 1V PORT
1 series · 2 of 2 positions shown · non-contrast
Comparison: None.

CLINICAL DATA: PICC placement.

EXAM:
PORTABLE CHEST - 1 VIEW

[Series 1: ap · 0.17mm/px · 2 of 2 slices shown]
[im 1/2]
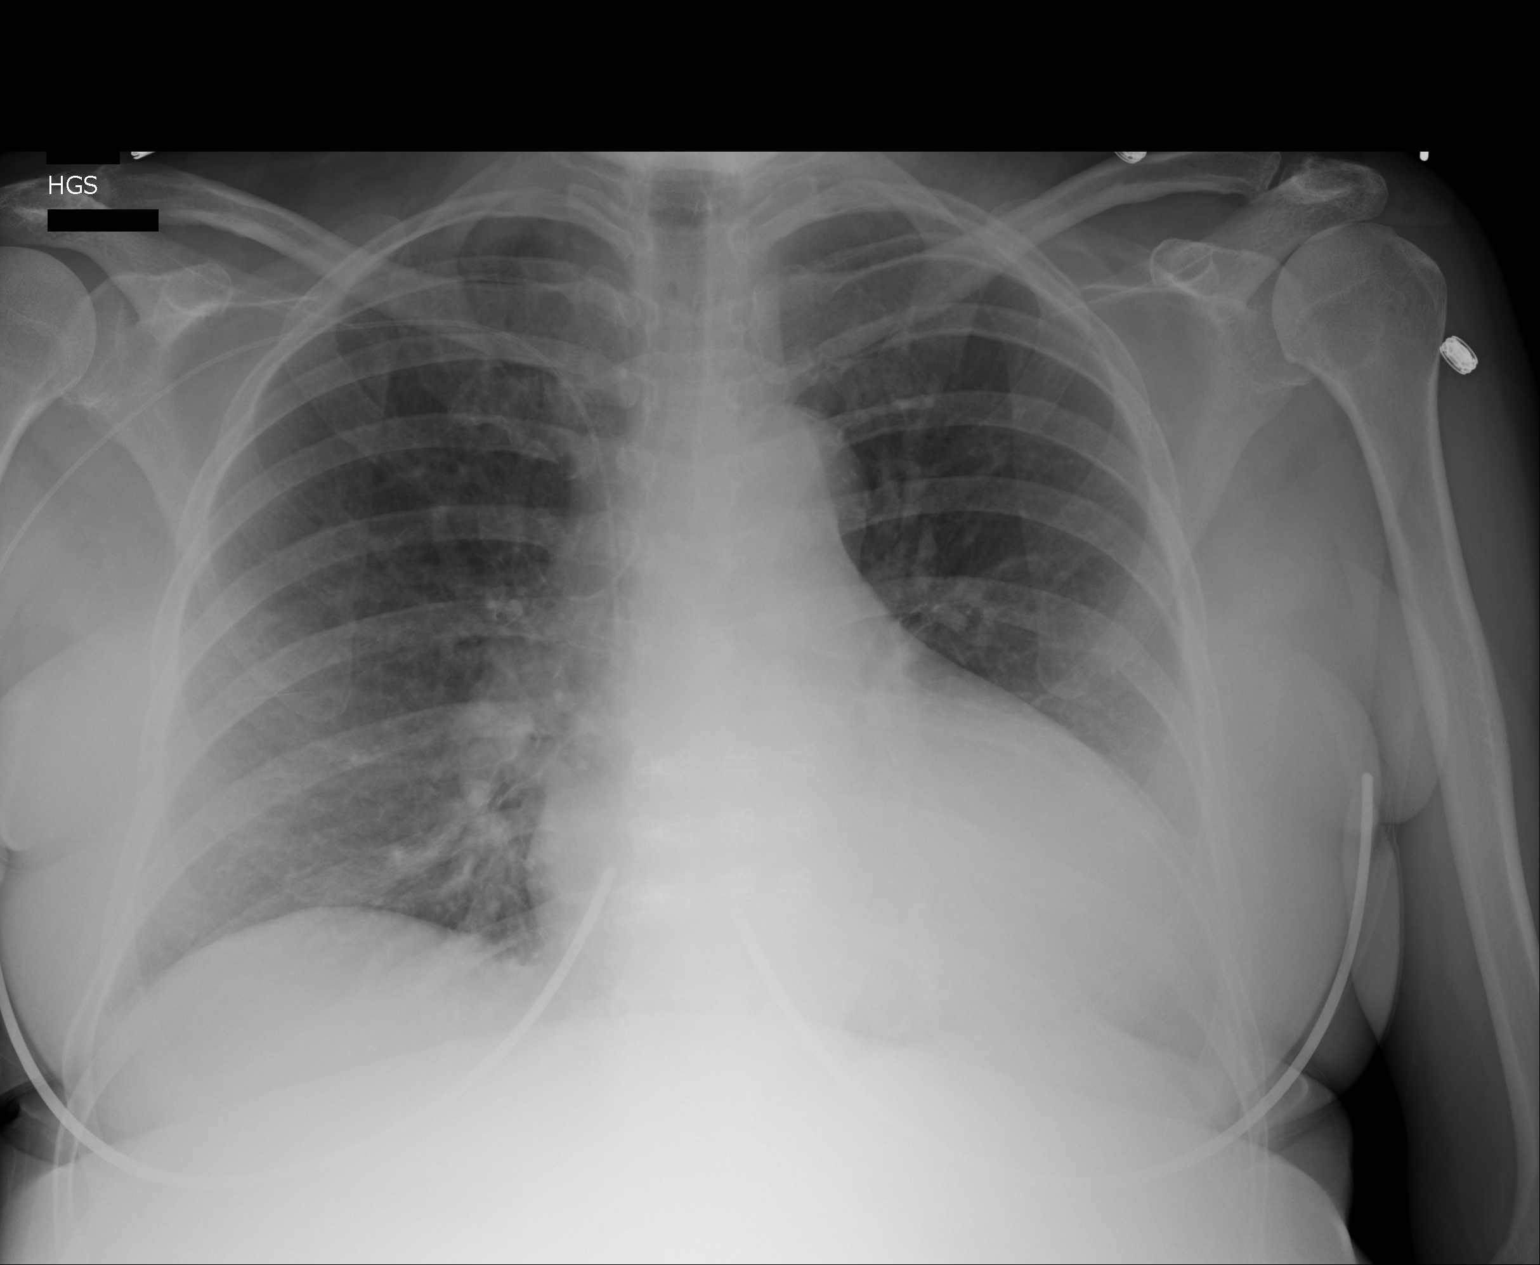
[im 2/2]
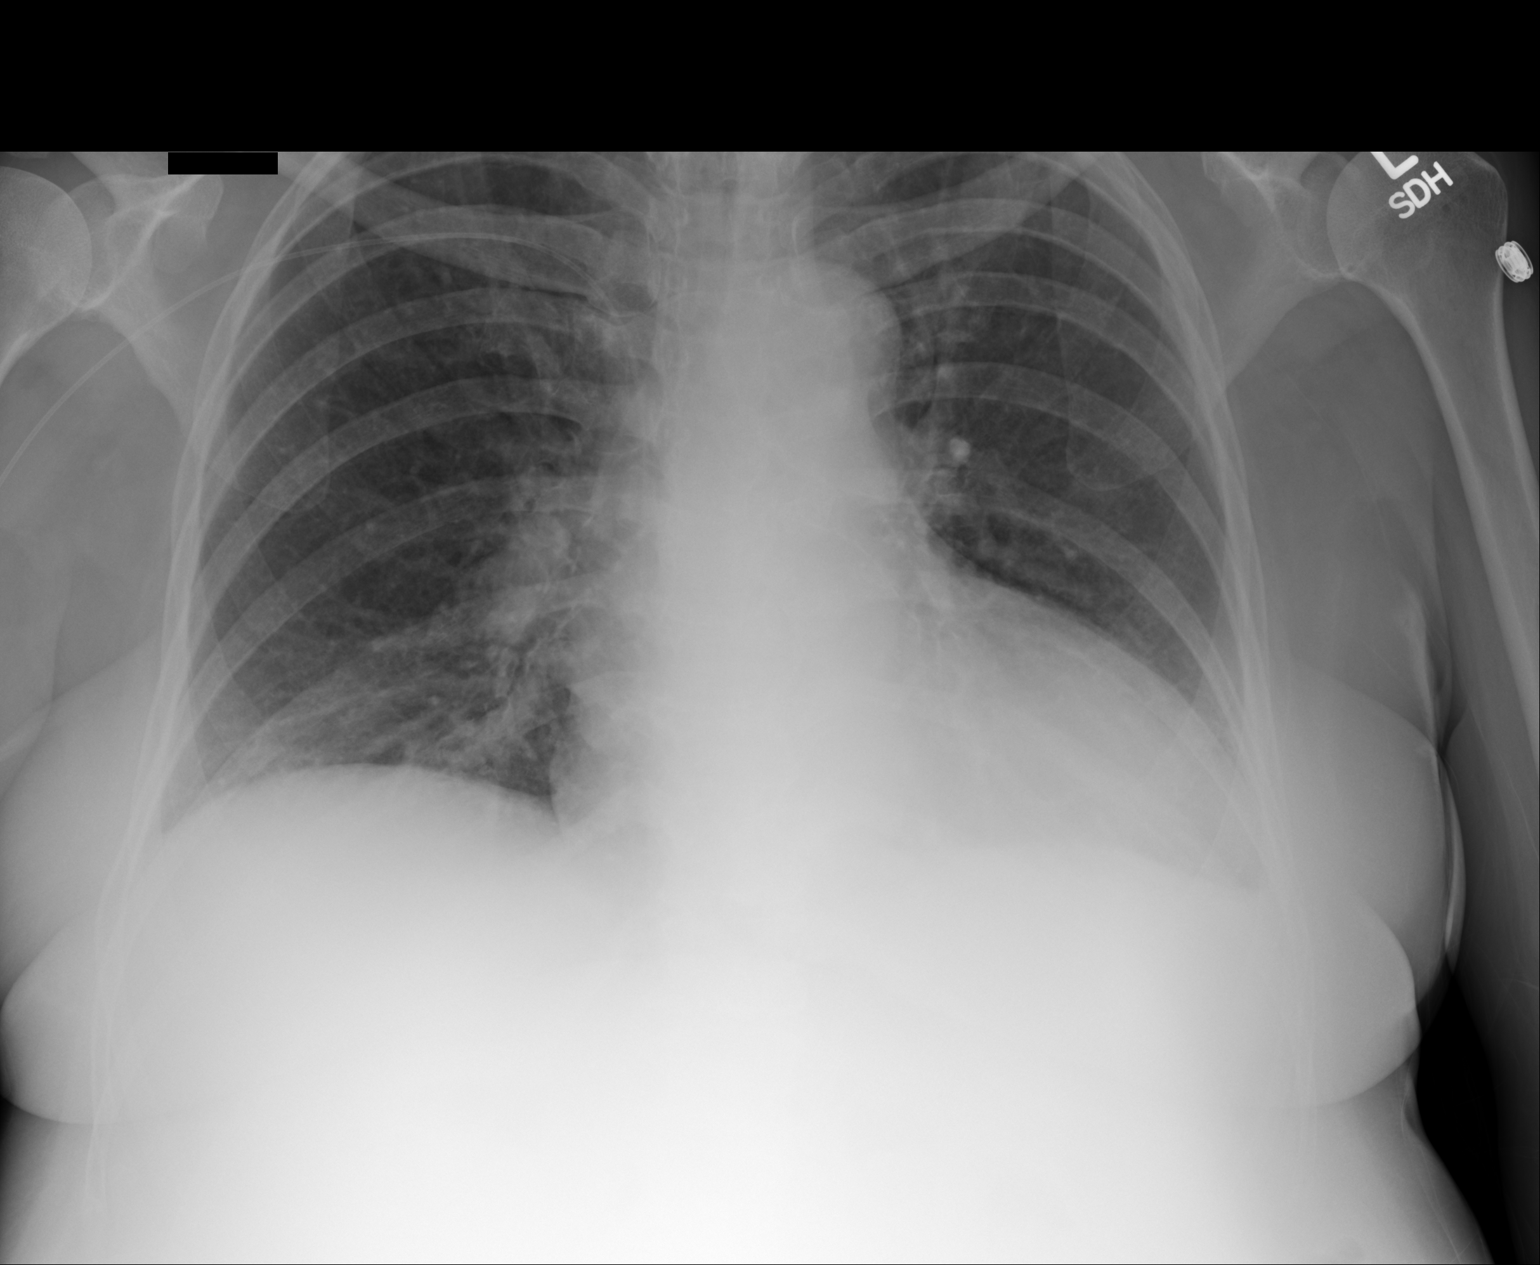

[2 of 2 positions shown; findings below may reference images not displayed]

FINDINGS: New RIGHT upper extremity PICC is present with the tip in the mid
SVC, just inferior to the carina. Basilar atelectasis.
Cardiopericardial silhouette is upper limits of normal for
projection. No airspace consolidation.
IMPRESSION: 1. New RIGHT upper extremity PICC with the tip in the mid SVC.
2. Upper limits of normal heart size without acute cardiopulmonary
disease.

## 2016-02-14 IMAGING — CR RIGHT FOOT COMPLETE - 3+ VIEW
1 series · 3 of 3 positions shown · non-contrast
Comparison: 12/24/2013.

ADDENDUM:
I spoke with Dr. Glademir Menoncin by telephone this morning. He stated
that there is no clinical evidence of osteomyelitis, and there was
no evidence of infection at the time of hardware removal. Therefore,
the periosteal new bone formation is most likely reactive and
related to the prior surgery. Some of this periosteal new bone
traverses the medial cuneiform-5st metatarsal joint, though this is
not yet completely fused.
CLINICAL DATA: Followup osteomyelitis involving the 1st metatarsal.
Prior attempted fusion of the 1st metatarsal to the medial cuneiform
with nonunion.

EXAM:
RIGHT FOOT COMPLETE - 3+ VIEW

[Series 1: ap · 0.17mm/px · 3 of 3 slices shown]
[im 1/3]
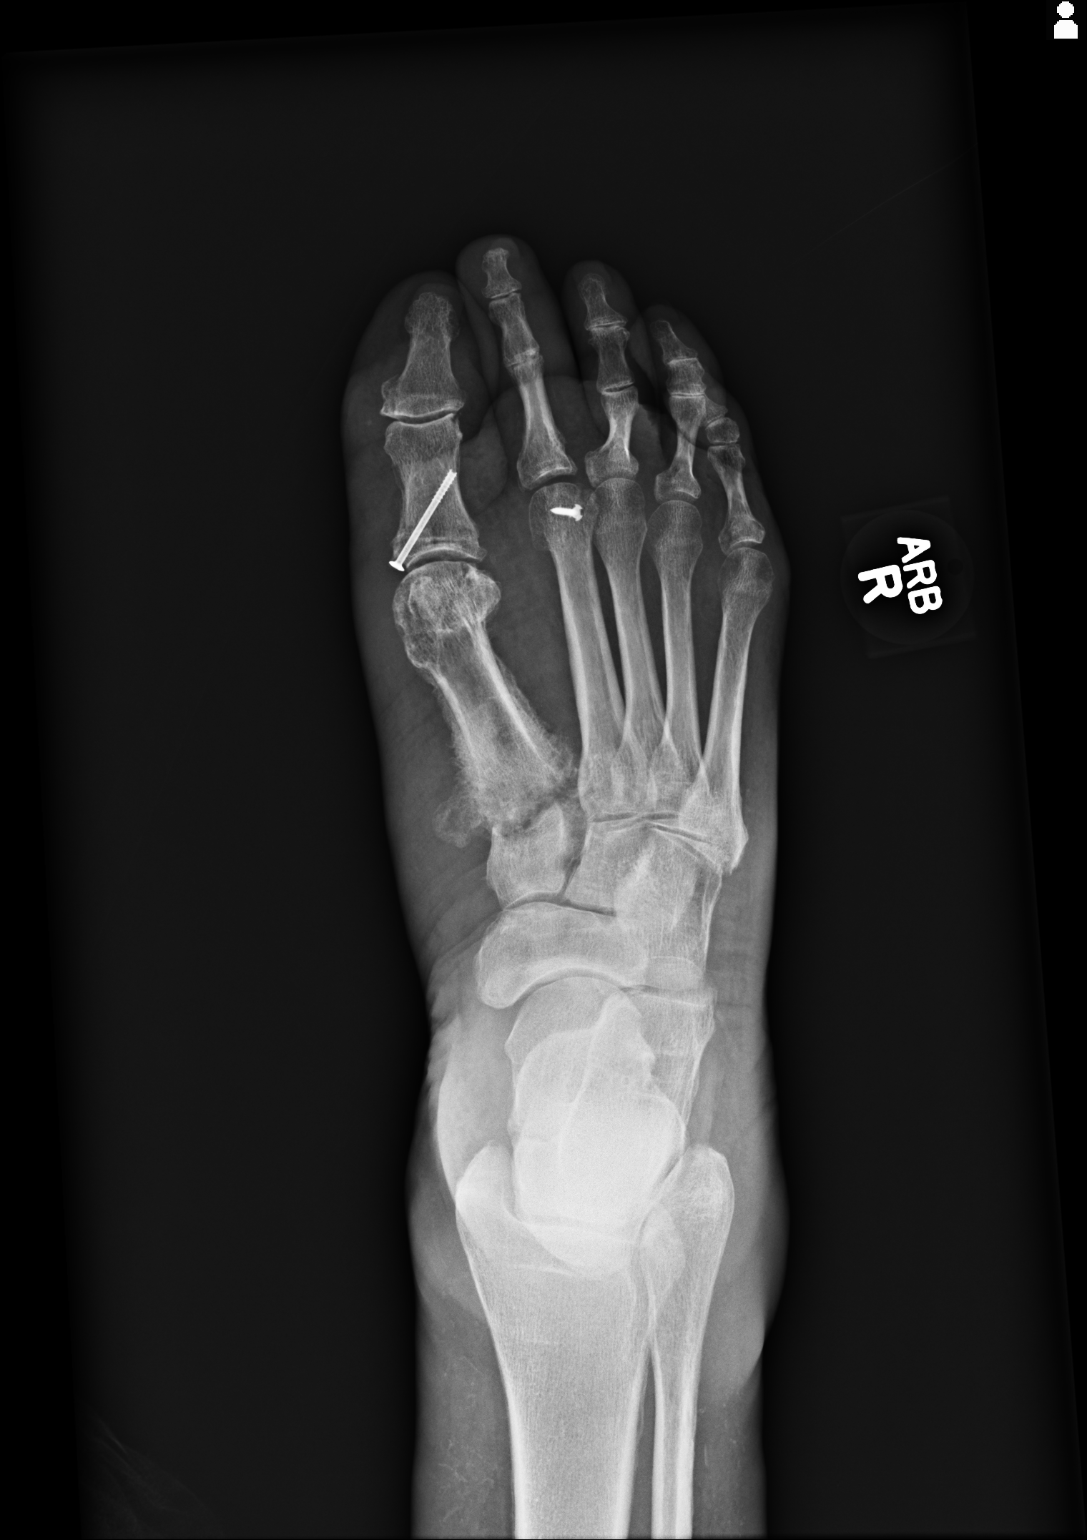
[im 2/3]
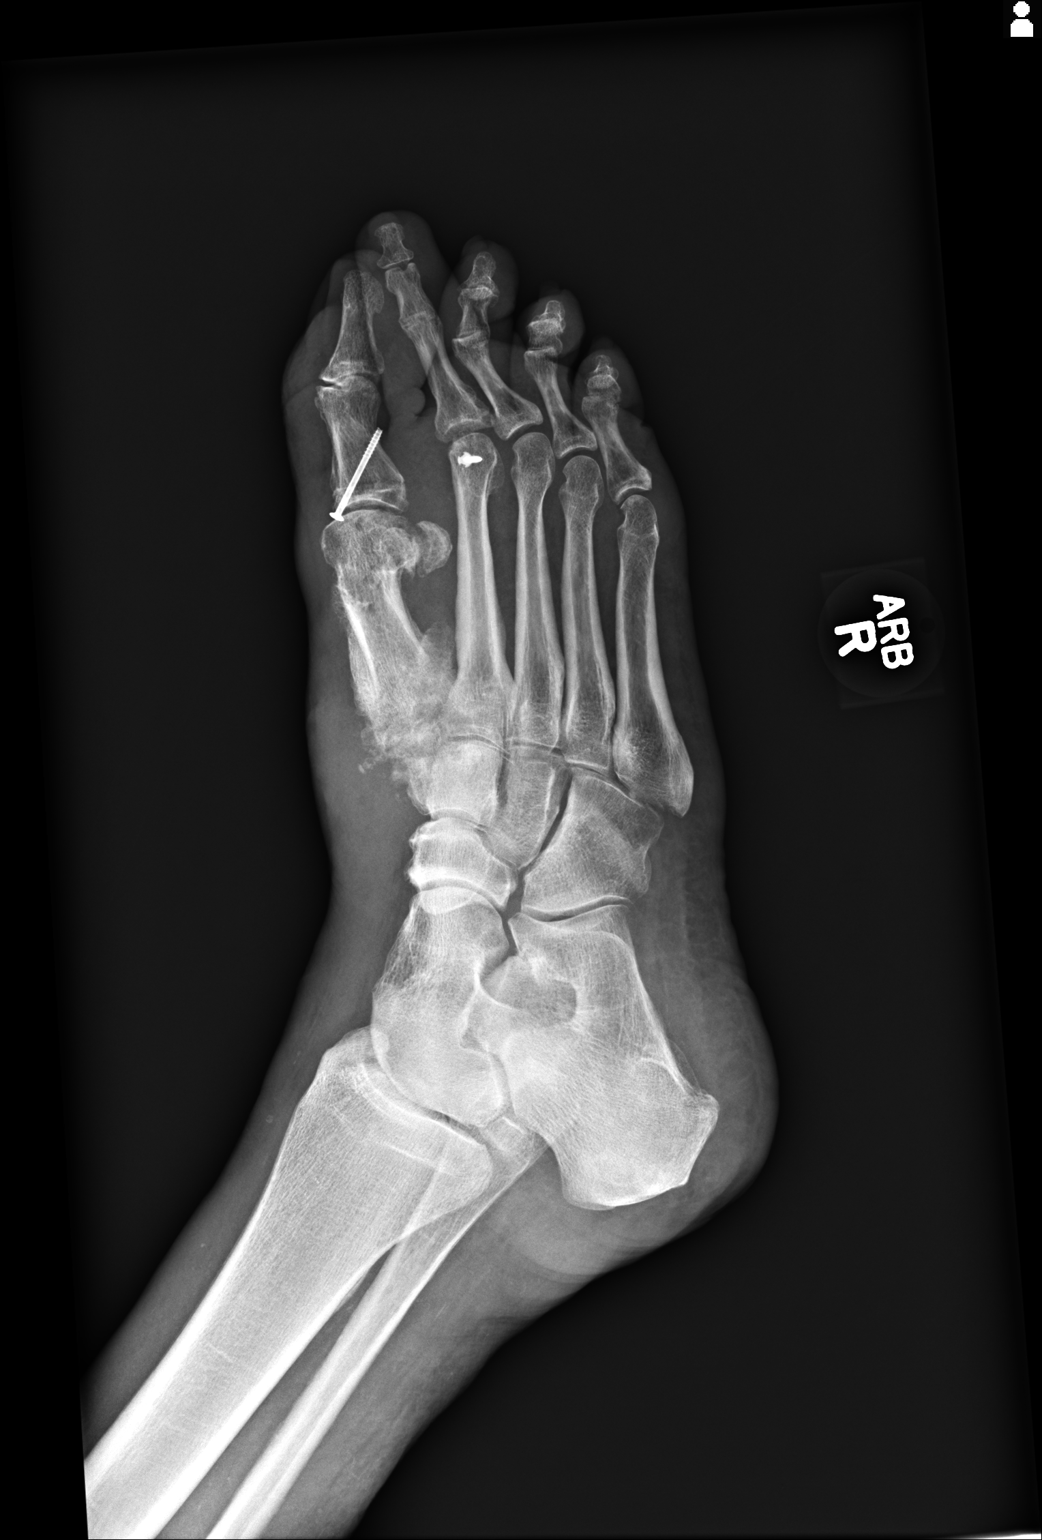
[im 3/3]
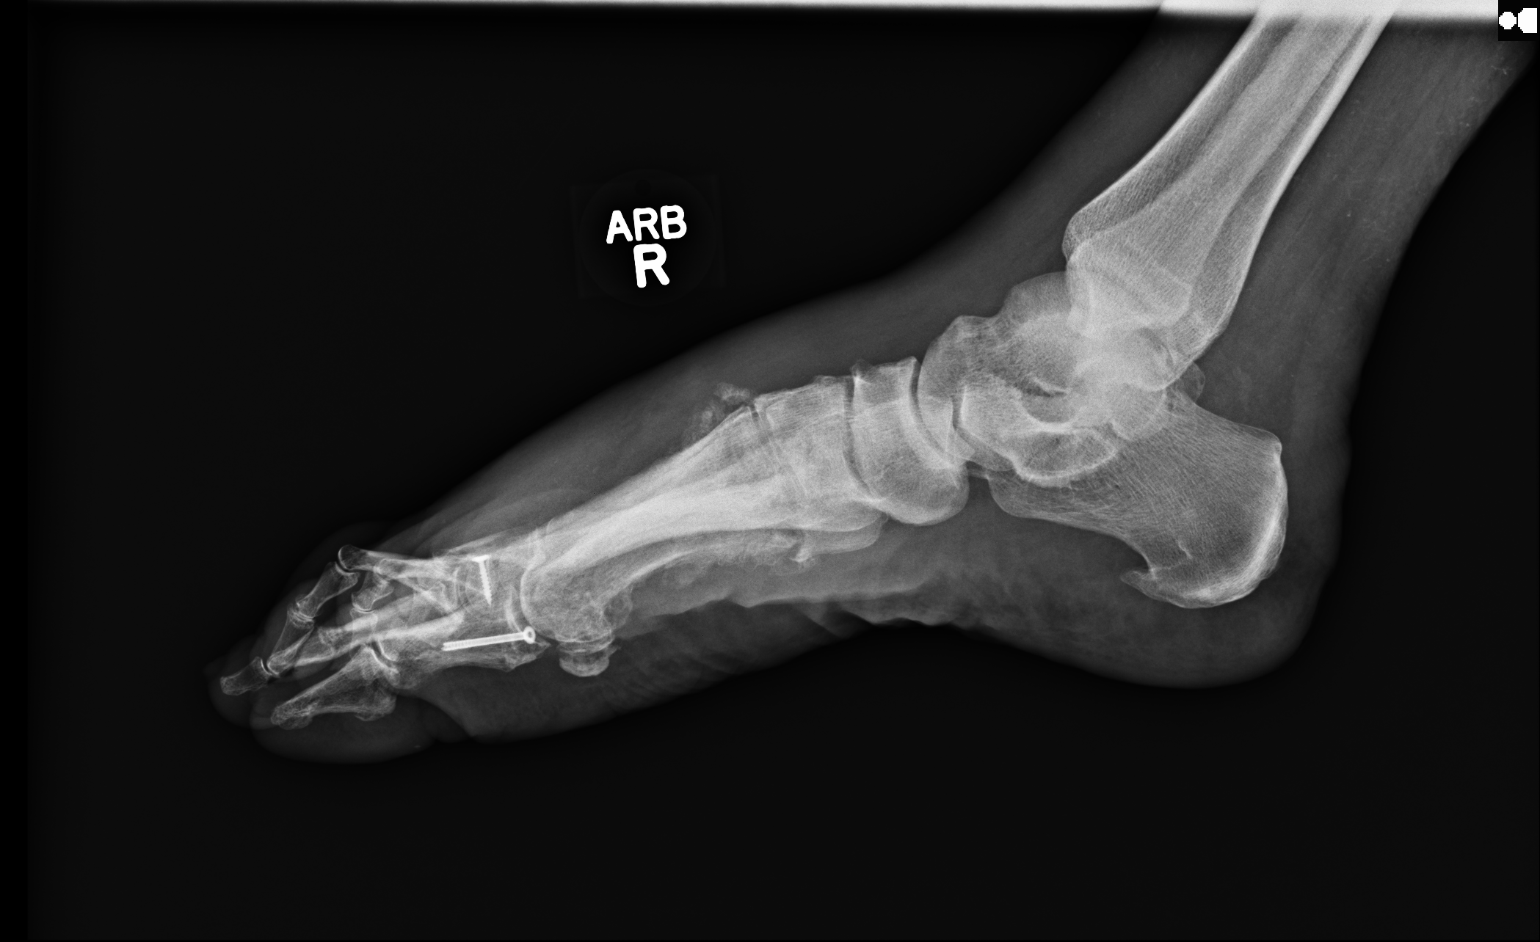

[3 of 3 positions shown; findings below may reference images not displayed]

FINDINGS: Interval hardware removal from the 1st metatarsal and the medial
cuneiform. Progressive irregular periosteal new bone formation
involving the shaft and the base of the 1st metatarsal. Similar
progressive irregular periosteal new bone formation involving the
lateral aspect of the medial cuneiform. No evidence of osteomyelitis
elsewhere. Narrowed IP joint space in multiple toes and narrowed
joints in the midfoot as noted previously. Moderate-sized plantar
calcaneal spur. Marked dorsal soft tissue swelling.
IMPRESSION: Progressive osteomyelitis involving the 1st metatarsal and the
medial cuneiform since the examination just over 1 month ago.

## 2017-05-17 IMAGING — DX DG CHEST 1V PORT
1 series · 1 of 1 positions shown · non-contrast
Comparison: January 29, 2014

CLINICAL DATA: Altered mental status. Hypotension. Heavy alcohol
use.

EXAM:
PORTABLE CHEST 1 VIEW

[chest ap]
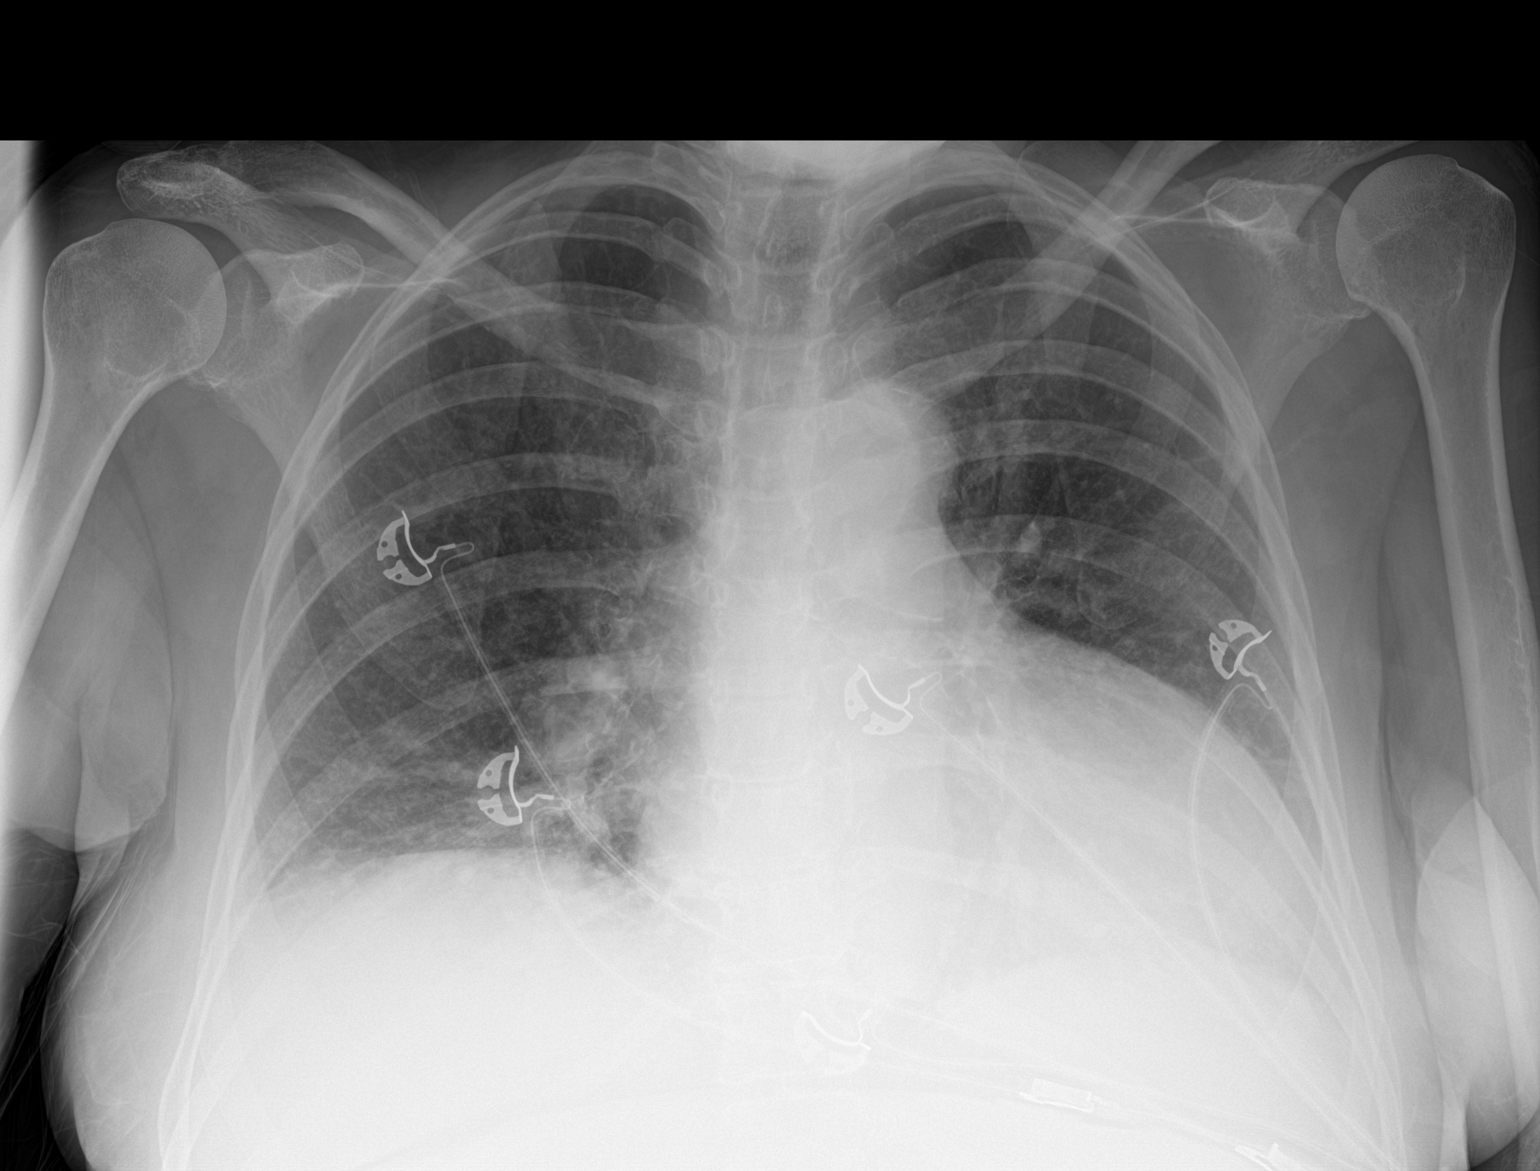

[1 of 1 positions shown; findings below may reference images not displayed]

FINDINGS: There is stable cardiomegaly with pulmonary vascularity within
normal limits. There is patchy infiltrate in the right base. Lungs
elsewhere clear. No bone lesions. No adenopathy.
IMPRESSION: Patchy infiltrate right base. Lungs elsewhere clear. Stable
cardiomegaly. Given the history, question a degree of aspiration in
the right base.
